# Patient Record
Sex: Male | Born: 1970 | Race: White | Hispanic: No | Marital: Married | Smoking: Current every day smoker
Health system: Southern US, Community
[De-identification: ages and names within clinical notes are randomized; demographics above are authoritative.]

## PROBLEM LIST (undated history)

## (undated) DIAGNOSIS — G43909 Migraine, unspecified, not intractable, without status migrainosus: Secondary | ICD-10-CM

## (undated) DIAGNOSIS — F32A Depression, unspecified: Secondary | ICD-10-CM

## (undated) DIAGNOSIS — F329 Major depressive disorder, single episode, unspecified: Secondary | ICD-10-CM

## (undated) DIAGNOSIS — M549 Dorsalgia, unspecified: Secondary | ICD-10-CM

## (undated) DIAGNOSIS — G8929 Other chronic pain: Secondary | ICD-10-CM

## (undated) DIAGNOSIS — F319 Bipolar disorder, unspecified: Secondary | ICD-10-CM

## (undated) HISTORY — PX: FOOT SURGERY: SHX648

---

## 1997-07-06 ENCOUNTER — Emergency Department (HOSPITAL_COMMUNITY): Admission: EM | Admit: 1997-07-06 | Discharge: 1997-07-06 | Payer: Self-pay | Admitting: Emergency Medicine

## 2000-05-06 ENCOUNTER — Emergency Department (HOSPITAL_COMMUNITY): Admission: EM | Admit: 2000-05-06 | Discharge: 2000-05-06 | Payer: Self-pay | Admitting: Emergency Medicine

## 2000-12-21 ENCOUNTER — Emergency Department (HOSPITAL_COMMUNITY): Admission: EM | Admit: 2000-12-21 | Discharge: 2000-12-21 | Payer: Self-pay | Admitting: *Deleted

## 2000-12-21 ENCOUNTER — Encounter: Payer: Self-pay | Admitting: *Deleted

## 2001-06-02 ENCOUNTER — Encounter: Payer: Self-pay | Admitting: Emergency Medicine

## 2001-06-02 ENCOUNTER — Emergency Department (HOSPITAL_COMMUNITY): Admission: EM | Admit: 2001-06-02 | Discharge: 2001-06-02 | Payer: Self-pay | Admitting: Emergency Medicine

## 2002-03-23 ENCOUNTER — Emergency Department (HOSPITAL_COMMUNITY): Admission: EM | Admit: 2002-03-23 | Discharge: 2002-03-23 | Payer: Self-pay | Admitting: Emergency Medicine

## 2003-01-31 ENCOUNTER — Emergency Department (HOSPITAL_COMMUNITY): Admission: EM | Admit: 2003-01-31 | Discharge: 2003-01-31 | Payer: Self-pay | Admitting: Emergency Medicine

## 2004-04-06 ENCOUNTER — Emergency Department (HOSPITAL_COMMUNITY): Admission: EM | Admit: 2004-04-06 | Discharge: 2004-04-06 | Payer: Self-pay | Admitting: Emergency Medicine

## 2004-11-03 ENCOUNTER — Emergency Department (HOSPITAL_COMMUNITY): Admission: EM | Admit: 2004-11-03 | Discharge: 2004-11-04 | Payer: Self-pay | Admitting: *Deleted

## 2005-05-17 ENCOUNTER — Emergency Department (HOSPITAL_COMMUNITY): Admission: EM | Admit: 2005-05-17 | Discharge: 2005-05-17 | Payer: Self-pay | Admitting: Emergency Medicine

## 2005-10-24 ENCOUNTER — Encounter: Payer: Self-pay | Admitting: Emergency Medicine

## 2005-10-24 ENCOUNTER — Ambulatory Visit: Payer: Self-pay | Admitting: *Deleted

## 2005-10-24 ENCOUNTER — Inpatient Hospital Stay (HOSPITAL_COMMUNITY): Admission: EM | Admit: 2005-10-24 | Discharge: 2005-10-24 | Payer: Self-pay | Admitting: Cardiology

## 2005-10-30 ENCOUNTER — Ambulatory Visit: Payer: Self-pay | Admitting: Cardiology

## 2005-12-19 ENCOUNTER — Ambulatory Visit: Payer: Self-pay | Admitting: Gastroenterology

## 2006-04-14 ENCOUNTER — Emergency Department (HOSPITAL_COMMUNITY): Admission: EM | Admit: 2006-04-14 | Discharge: 2006-04-15 | Payer: Self-pay | Admitting: Emergency Medicine

## 2006-11-24 ENCOUNTER — Emergency Department (HOSPITAL_COMMUNITY): Admission: EM | Admit: 2006-11-24 | Discharge: 2006-11-24 | Payer: Self-pay | Admitting: Emergency Medicine

## 2007-08-02 ENCOUNTER — Emergency Department (HOSPITAL_COMMUNITY): Admission: EM | Admit: 2007-08-02 | Discharge: 2007-08-02 | Payer: Self-pay | Admitting: Emergency Medicine

## 2009-10-29 ENCOUNTER — Emergency Department (HOSPITAL_COMMUNITY): Admission: EM | Admit: 2009-10-29 | Discharge: 2009-10-29 | Payer: Self-pay | Admitting: Emergency Medicine

## 2010-03-20 ENCOUNTER — Emergency Department (HOSPITAL_COMMUNITY)
Admission: EM | Admit: 2010-03-20 | Discharge: 2010-03-21 | Disposition: A | Discharge status: Home / Self Care | Payer: Self-pay | Attending: Emergency Medicine | Admitting: Emergency Medicine

## 2010-03-20 DIAGNOSIS — R252 Cramp and spasm: Secondary | ICD-10-CM | POA: Insufficient documentation

## 2010-06-15 ENCOUNTER — Emergency Department (HOSPITAL_COMMUNITY)
Admission: EM | Admit: 2010-06-15 | Discharge: 2010-06-16 | Disposition: A | Discharge status: Home / Self Care | Payer: Self-pay | Attending: Emergency Medicine | Admitting: Emergency Medicine

## 2010-06-15 DIAGNOSIS — N201 Calculus of ureter: Secondary | ICD-10-CM | POA: Insufficient documentation

## 2010-06-16 ENCOUNTER — Emergency Department (HOSPITAL_COMMUNITY): Payer: Self-pay

## 2010-06-16 LAB — DIFFERENTIAL
Basophils Relative: 0 % (ref 0–1)
Lymphocytes Relative: 8 % — ABNORMAL LOW (ref 12–46)
Lymphs Abs: 1.2 10*3/uL (ref 0.7–4.0)
Monocytes Relative: 5 % (ref 3–12)
Neutro Abs: 12.5 10*3/uL — ABNORMAL HIGH (ref 1.7–7.7)
Neutrophils Relative %: 86 % — ABNORMAL HIGH (ref 43–77)

## 2010-06-16 LAB — COMPREHENSIVE METABOLIC PANEL
AST: 16 U/L (ref 0–37)
CO2: 25 mEq/L (ref 19–32)
Chloride: 103 mEq/L (ref 96–112)
Creatinine, Ser: 1.36 mg/dL (ref 0.4–1.5)
GFR calc Af Amer: >60 mL/min (ref >60)
GFR calc non Af Amer: 58 mL/min — ABNORMAL LOW (ref >60)
Glucose, Bld: 124 mg/dL — ABNORMAL HIGH (ref 70–99)
Total Bilirubin: 0.4 mg/dL (ref 0.3–1.2)

## 2010-06-16 LAB — CBC
HCT: 41.2 % (ref 39.0–52.0)
Hemoglobin: 14.1 g/dL (ref 13.0–17.0)
MCH: 31.3 pg (ref 26.0–34.0)
MCV: 91.6 fL (ref 78.0–100.0)
RBC: 4.5 MIL/uL (ref 4.22–5.81)

## 2010-06-16 MED ORDER — IOHEXOL 300 MG/ML  SOLN
100.0000 mL | Freq: Once | INTRAMUSCULAR | Status: AC | PRN
  Administered 2010-06-16: 100 mL via INTRAVENOUS

## 2010-07-06 NOTE — H&P (Signed)
NAMEESMERALDA, [name]               ACCOUNT NO.:  [id number]   MEDICAL RECORD NO.:  [id number]          PATIENT TYPE:  INP   LOCATION:  3733                         FACILITY:  [facility]   PHYSICIAN:  Garrison Columbus. Haithcock, MDDATE OF BIRTH:  1970-03-23   DATE OF ADMISSION:  10/24/2005  DATE OF DISCHARGE:                                HISTORY & PHYSICAL   CHIEF COMPLAINT:  Chest pain.   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  He presented to the  emergency room and was found to have a non-ST-segment-elevation MI and was  transferred to [facility] for cardiac catheterization.   PAST MEDICAL HISTORY:  Hypertension.   FAMILY HISTORY:  Coronary artery disease and diabetes.   REVIEW OF SYSTEMS:  Otherwise negative.   MEDICATIONS:  Verapamil, unknown dose.   PHYSICAL EXAMINATION:  VITAL SIGNS:  Temperature 98.7, blood pressure  132/74, pulse is 53.  GENERAL:  He is a pleasant man in no apparent distress.  NECK:  Supple.  LUNGS:  Clear to auscultation bilaterally.  CARDIOVASCULAR:  Regular, S1 and S2, with no murmurs, rubs, or gallops.  ABDOMEN:  Soft and nontender.  NEUROLOGICAL:  Exam was nonfocal.   LABORATORY AND ACCESSORY CLINICAL DATA:  Labs pending.   EKG:  Nonspecific ST changes.   ASSESSMENT AND PLAN:  [age]-year-old gentleman with atypical chest  pain story and nonspecific ST changes.  We will admit after emergent cardiac  catheterization and we will manage based on the results of that study.      Daniel B. Haithcock, MD  Electronically Signed     DBH/MEDQ  D:  10/24/2005  T:  10/24/2005  Job:  [id number]

## 2010-07-06 NOTE — Assessment & Plan Note (Signed)
[facility]                           GASTROENTEROLOGY OFFICE NOTE   NAME:Montilla, REES SANTISTEVAN                      MRN:          [id number]  DATE:12/19/2005                            DOB:          12-19-1970    REFERRING PHYSICIAN:  Corrie Dandy, NP Daphine Deutscher   REASON FOR REFERRAL:  Chest pain.   HISTORY OF PRESENT ILLNESS:  Mr. Molinelli is a 40 year old white male who  relates a 42-month history of recurrent chest pain.  He describes chest pain  in his mid anterior chest and left upper chest that is brought on by  coughing, movement and lifting.  It  has not been associated with exertion  or shortness of breath.  He has no abdominal pain, reflux symptoms, nausea,  vomiting, weight loss, dysphagia, odynophagia, melena, hematochezia or  change in his bowel habits.  He was hospitalized in September of this year  for a severe episode of chest pain that radiated into his back and jaw,  associated with dizziness.  He was seen at [facility] and referred  to [facility]. He underwent cardiac catheterization which  apparently revealed normal coronary arteries and ejection fraction of 60%.  He was placed on Protonix which he has taken regularly for the past 4 to 6  weeks with no change in symptoms.  He has taken Xanax twice a day for  management of anxiety for about 2 years. Tylenol and Advil have been  somewhat helpful with his symptoms but only give partial and temporary  relief.  He is employed at a [employer] and does a substantial amount of  physical labor which tends to exacerbate his chest complaints.  I reviewed  the discharge summary from his hospitalization dated October 24, 2005 as  well as the emergency room note for that same admission.  In addition, he  relates cough productive of clear sputum for about the past 2 weeks.   PAST MEDICAL HISTORY:  Hypertension and anxiety.   PAST SURGICAL HISTORY:  Negative.   SOCIAL HISTORY:  He is  married with 2 children.  He works in a Doctor, general practice.  He smokes 1 pack of cigarettes a day and denies  alcohol usage.   REVIEW OF SYSTEMS:  Remarkable for sleeping problems and occasional dyspnea.   PHYSICAL EXAMINATION:  Well-developed, well-nourished white male who appears  mildly anxious.  Height 6 feet 1 inch.  Weight 161.4 pounds.  Blood pressure is 120/70. Pulse  88 and regular.  HEENT:  Anicteric sclerae, oropharynx clear, edentulous.  NECK:  Without thyromegaly or adenopathy.  CHEST:  Clear to auscultation bilaterally.  There is mild upper sternal and  left upper chest wall tenderness.  He did cough with deep inspiration during  his chest examination which precipitated his chest pain symptoms.  CARDIAC:  Regular rate and rhythm without murmurs appreciated.  ABDOMEN:  Soft, nontender, nondistended, normoactive bowel sounds.  No  palpable organomegaly, masses or hernias.  EXTREMITIES:  No clubbing, cyanosis or edema.  NEUROLOGIC:  Alert and oriented x3.  Grossly nonfocal.   ASSESSMENT AND  PLAN:  Musculoskeletal chest pain.  R/O pulmonary etiologies.  No evidence for gastrointestinal disorders.  Begin Celebrex 200 mg p.o.  b.i.d.  May discontinue Protonix.  Defer further evaluation and followup to  Paulene Floor, NP.  I have asked the patient to schedule a followup  appointment with her within the next several days.     Venita Lick. Russella Dar, MD, [facility]  Electronically Signed    MTS/MedQ  DD: 12/19/2005  DT: 12/19/2005  Job #: [id number]   cc:   Corrie Dandy, NP Daphine Deutscher

## 2010-07-06 NOTE — Discharge Summary (Signed)
[name], [name] NO.:  [id number]   MEDICAL RECORD NO.:  [id number]          PATIENT TYPE:  INP   LOCATION:  3733                         FACILITY:  [facility]   PHYSICIAN:  Joellyn Rued, PA-C     DATE OF BIRTH:  08/17/1970   DATE OF ADMISSION:  10/24/2005  DATE OF DISCHARGE:  10/24/2005                           DISCHARGE SUMMARY - REFERRING   DISCHARGE DIAGNOSES:  1. Noncardiac chest discomfort.  Cardiac catheterization did not reveal      any evidence of coronary artery disease.  2. Hypertension.  3. Tobacco use.   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  He presented to the hospital at [facility].  It was felt that  he was having a myocardial infarction.  He was transferred to [facility] for  cardiac catheterization.   MEDICAL HISTORY:  Notable for tobacco use and hypertension.   LABORATORY:  Chest x-ray at [facility] did not show any acute disease.  H&H  on admission was 12.4 and 36, normal indices, platelets 138, WBCs 5.1.  PTT  32, PT 15.1.  D-dimer less than 0.22.  Admission sodium 141, potassium 388,  BUN 10, creatinine 1.1.  LFTs within normal limits.  CK, MB and troponin  were within normal limits .  EKGs from [facility] are reported to showed  nonspecific ST-T wave changes.  However, I cannot find EKGs on the chart at  the time of this dictation.  Prior to discharge, EKG showed normal sinus  rhythm, baseline artifact, PVC.   HOSPITAL COURSE:  [name] was taken to the cath lab by Dr. Juanda Chance.  His  catheterization did not reveal any coronary artery disease.  EF was 60%.  Dr. Juanda Chance felt his symptoms may be related to reflux and felt that post his  bed rest if his catheterization site was intact, he may be discharged home  with followup groin check and  appointment with his primary care physician.  On the morning of September 6, Dr. Eden Emms reassessed the patient and agreed  with plan.   DISPOSITION:  The patient is discharged home.  Asked to maintain low salt,  fat cholesterol diet.  Activities and wound care are per supplemental  discharge sheet.  He is asked to bring all medications to all follow-up  appointments.  He received a new prescription for Protonix 40 mg daily  and  asked to continue his home medications which are believed to include Xanax  and verapamil of unknown dosage.  He will follow up with Dr.  Daleen Squibb in the office for a groin check on October 30, 2005 at 11:15 and he  was asked to make an appointment with his medical doctor for follow-up.  He  was also advised no smoking or tobacco products.  He was given permission  return to work on October 27, 2005.  Discharge time less than 30 minutes.  ______________________________  Joellyn Rued, PA-C     EW/MEDQ  D:  10/24/2005  T:  10/24/2005  Job:  [id number]   cc:   Dr. Amada Jupiter

## 2010-07-06 NOTE — Assessment & Plan Note (Signed)
[facility]                         [place] CARDIOLOGY OFFICE NOTE   NAME:Medellin, Cohan                        MRN:          [id number]  DATE:10/30/2005                            DOB:          [birth year]    HISTORY OF PRESENT ILLNESS:  Mr. [name] returns today for followup of his  chest tightness and discomfort. He was transferred to [facility]  with a question of acute MI from [facility]. Cardiac  catheterization showed no obstructive coronary disease with normal left  ventricular function. He still is having 3 out of 10 chest discomfort in the  center of his chest. He is on Protonix. It was felt that most of his  symptoms were gastroesophageal reflux. He has had no melena, hematochezia.  He looks well otherwise. He is still smoking.   MEDICATIONS:  Verapamil 120 mg daily, alprazolam 2 mg b.i.d.   PHYSICAL EXAMINATION:  VITAL SIGNS:  Blood pressure today is 104/64, pulse  89 and regular. He is 6 foot 1 and weighs 158 pounds.  NECK:  Carotids are full. There is no JVD. Thyroid is not enlarged. Trachea  is midline.  LUNGS:  Clear.  CARDIOVASCULAR:  Regular rate and rhythm.  ABDOMEN:  Soft. Groin site shows a small ecchymotic area but no bruit.  Pulses were present.   LABORATORY DATA:  EKG is essentially normal.   ASSESSMENT:  1. Non-cardiac chest discomfort.  2. Probable gastroesophageal reflux.   RECOMMENDATIONS:  1. Continue Protonix.  2. Continue Verapamil for the time being for possible esophageal spasm.  3. Discontinue smoking.  4. We will see him back on a p.r.n. basis.                                   Thomas C. Daleen Squibb, MD, [facility]   TCW/MedQ  DD:  10/30/2005  DT:  10/31/2005  Job #:  [id number]

## 2010-07-06 NOTE — Cardiovascular Report (Signed)
[name], [name]               ACCOUNT NO.:  [id number]   MEDICAL RECORD NO.:  [id number]          PATIENT TYPE:  INP   LOCATION:  3733                         FACILITY:  [facility]   PHYSICIAN:  Everardo Beals. Juanda Chance, MD, FACCDATE OF BIRTH:  04-24-1970   DATE OF PROCEDURE:  10/24/2005  DATE OF DISCHARGE:                              CARDIAC CATHETERIZATION   CLINICAL HISTORY:  [name] is a 40 year old who has no prior history of  known heart disease.  He is a smoker.  Tonight, while he was working in the  [employer], he developed substernal tightness.  He went to [facility]  emergency room where his ECG showed what was felt to be mild ST elevation.  He was transferred to [facility] via [facility] and evaluated by Dr. Almyra Brace and myself.  We were not certain that his ECG was diagnostic of a  STEMI, but because of his symptoms we decided to proceed with  catheterization.   PROCEDURE:  The procedure was performed at the right femoral artery and  arterial sheath and 6-French preformed coronary catheters.  A front wall  anterior puncture was performed, Omnipaque contrast was used.  The right  femoral was closed Angio-Seal at the end of the procedure.  The patient  tolerated the procedure well and left the laboratory in satisfactory  condition.   RESULTS:  Aortic pressure was 115/74 with mean of 94.  The left ventricle  pressure was 115/14.   Left main coronary artery of the short vessel was free state of significant  disease.   Left anterior descending artery gave rise to two diagonal branches and a  septal perforator.  These and the LAD proper were free of significant  disease.   The circumflex artery gave rise to a marginal branch and AV branch which  terminated into a small posterolateral branch.  These vessels were free of  significant disease.   The right coronary artery was a moderate-sized vessel that gave rise to two  right ventricular  branches, a posterior  descending branch and a small  posterolateral branch.  These vessels were free of significant disease.   The left ventriculogram performed in the RAO projection showed good wall  motion with no areas of hypokinesis.  Estimated fraction was 6%.   CONCLUSION:  Normal coronary angiography, left ventricular wall motion.   In view of these findings, I think the patient's symptoms are not cardiac.  I do not think he had an ST elevation infarction, and I think it is not  likely that his symptoms are cardiac.  He does have significant symptoms of  esophageal reflux.  I suspect this is the etiology of his symptoms.  Will  check a D-dimer and cardiac enzymes.  If these are negative, will plan  discharge tomorrow with treatment for GERD with a proton pump inhibitor.           ______________________________  Everardo Beals Juanda Chance, MD, [facility]     BRB/MEDQ  D:  10/24/2005  T:  10/24/2005  Job:  [id number]   cc:   Dr. Zenda Alpers  Cardiopulmonary Lab

## 2010-07-06 NOTE — Procedures (Signed)
NAMEWRIGLEY, PLASENCIA NO.:  [id number]   MEDICAL RECORD NO.:  [id number]          PATIENT TYPE:  EMS   LOCATION:  ED                            FACILITY:  [facility]   PHYSICIAN:  Edward L. Juanetta Gosling, M.D.DATE OF BIRTH:  Dec 16, 1970   DATE OF PROCEDURE:  DATE OF DISCHARGE:  10/24/2005                                EKG INTERPRETATION   The time is 0022, October 24, 2005.   The rhythm is sinus rhythm with a rate in the 60s.  The axis is vertical.  The computers read, inferior injury or acute infarction.  I do not see  evidence of that.  There is minimal ST elevation inferiorly, which looks  more like an interventricular conduction delay.   Mildly abnormal electrocardiogram.      Edward L. Juanetta Gosling, M.D.  Electronically Signed     ELH/MEDQ  D:  10/24/2005  T:  10/25/2005  Job:  [id number]

## 2010-12-02 ENCOUNTER — Emergency Department (HOSPITAL_COMMUNITY): Payer: Self-pay

## 2010-12-02 ENCOUNTER — Encounter: Payer: Self-pay | Admitting: *Deleted

## 2010-12-02 ENCOUNTER — Emergency Department (HOSPITAL_COMMUNITY)
Admission: EM | Admit: 2010-12-02 | Discharge: 2010-12-02 | Disposition: A | Discharge status: Home / Self Care | Payer: Self-pay | Attending: Emergency Medicine | Admitting: Emergency Medicine

## 2010-12-02 DIAGNOSIS — X500XXA Overexertion from strenuous movement or load, initial encounter: Secondary | ICD-10-CM | POA: Insufficient documentation

## 2010-12-02 DIAGNOSIS — M545 Low back pain, unspecified: Secondary | ICD-10-CM | POA: Insufficient documentation

## 2010-12-02 DIAGNOSIS — IMO0002 Reserved for concepts with insufficient information to code with codable children: Secondary | ICD-10-CM | POA: Insufficient documentation

## 2010-12-02 DIAGNOSIS — M5416 Radiculopathy, lumbar region: Secondary | ICD-10-CM

## 2010-12-02 HISTORY — DX: Depression, unspecified: F32.A

## 2010-12-02 HISTORY — DX: Major depressive disorder, single episode, unspecified: F32.9

## 2010-12-02 MED ORDER — PREDNISONE 20 MG PO TABS
60.0000 mg | ORAL_TABLET | Freq: Once | ORAL | Status: AC
  Administered 2010-12-02: 60 mg via ORAL
  Filled 2010-12-02: qty 3

## 2010-12-02 MED ORDER — PREDNISONE 50 MG PO TABS: 50.0000 mg | ORAL_TABLET | Freq: Every day | ORAL | Status: AC

## 2010-12-02 MED ORDER — HYDROCODONE-ACETAMINOPHEN 5-325 MG PO TABS
1.0000 | ORAL_TABLET | Freq: Once | ORAL | Status: AC
  Administered 2010-12-02: 1 via ORAL
  Filled 2010-12-02: qty 1

## 2010-12-02 MED ORDER — HYDROCODONE-ACETAMINOPHEN 5-325 MG PO TABS: ORAL_TABLET | ORAL | Status: DC

## 2010-12-02 NOTE — ED Notes (Signed)
Pt states lower back pain, radiating down left leg. Pain x 2 weeks.

## 2010-12-02 NOTE — ED Provider Notes (Signed)
Medical screening examination/treatment/procedure(s) were performed by non-physician practitioner and as supervising physician I was immediately available for consultation/collaboration.  Glynn Octave, MD 12/02/10 [phone]

## 2010-12-02 NOTE — ED Provider Notes (Signed)
History     CSN: [id number] Arrival date & time: 12/02/2010 10:08 AM  Chief Complaint  Patient presents with  . Back Pain    (Consider location/radiation/quality/duration/timing/severity/associated sxs/prior treatment) HPI Comments: States he twisted to get out of his car ~ 2 weeks ago and felt a "pop" and has pain in lower back since then.  He describes pain and numbness radiating down L leg to his foot.  Has taken an occasional ibuprofen with minimal relief.  Patient is a 40 y.o. male presenting with back pain. The history is provided by the patient. No language interpreter was used.  Back Pain  This is a new problem. Episode onset: 2 weeks ago. The problem occurs constantly. The problem has not changed since onset.The pain is associated with twisting. The symptoms are aggravated by certain positions and bending. Associated symptoms include numbness and leg pain. Pertinent negatives include no weakness.    Past Medical History  Diagnosis Date  . Depression     No past surgical history on file.  No family history on file.  History  Substance Use Topics  . Smoking status: Current Everyday Smoker  . Smokeless tobacco: Not on file  . Alcohol Use: No      Review of Systems  Musculoskeletal: Positive for back pain.  Neurological: Positive for numbness. Negative for weakness.  All other systems reviewed and are negative.    Allergies  Review of patient's allergies indicates no known allergies.  Home Medications  No current outpatient prescriptions on file.  BP 129/87  Pulse 63  Temp(Src) 97.9 F (36.6 C) (Oral)  Resp 16  Ht 6\' 1"  (1.854 m)  Wt 176 lb (79.833 kg)  BMI 23.22 kg/m2  SpO2 100%  Physical Exam  Nursing note and vitals reviewed. Constitutional: He is oriented to person, place, and time. He appears well-developed and well-nourished. No distress.  HENT:  Head: Normocephalic and atraumatic.  Eyes: EOM are normal.  Neck: Normal range of motion.    Cardiovascular: Normal rate, regular rhythm, normal heart sounds and intact distal pulses.   Pulmonary/Chest: Effort normal and breath sounds normal. No respiratory distress.  Abdominal: Soft. He exhibits no distension. There is no tenderness.  Musculoskeletal: He exhibits tenderness.       Back:       Legs:      Localizes pain to area of L SI joint with radiation to bottom of L foot.  Neurological: He is alert and oriented to person, place, and time. He has normal strength. He displays no atrophy and no tremor. He displays no seizure activity. GCS eye subscore is 4. GCS verbal subscore is 5. GCS motor subscore is 6.  Reflex Scores:      Patellar reflexes are 2+ on the right side and 2+ on the left side.      Achilles reflexes are 2+ on the right side and 2+ on the left side. Skin: Skin is warm and dry. He is not diaphoretic.  Psychiatric: He has a normal mood and affect. Judgment normal.    ED Course  Procedures (including critical care time)  Labs Reviewed - No data to display No results found.   No diagnosis found.    MDM  Shared x-ray results with pt.  Will f/u with ortho.        Worthy Rancher, PA 12/02/10 [phone]

## 2011-03-17 ENCOUNTER — Other Ambulatory Visit: Payer: Self-pay

## 2011-03-17 ENCOUNTER — Emergency Department (HOSPITAL_COMMUNITY): Payer: Self-pay

## 2011-03-17 ENCOUNTER — Encounter (HOSPITAL_COMMUNITY): Payer: Self-pay | Admitting: Emergency Medicine

## 2011-03-17 ENCOUNTER — Emergency Department (HOSPITAL_COMMUNITY)
Admission: EM | Admit: 2011-03-17 | Discharge: 2011-03-17 | Disposition: A | Discharge status: Home / Self Care | Payer: Self-pay | Attending: Emergency Medicine | Admitting: Emergency Medicine

## 2011-03-17 DIAGNOSIS — F329 Major depressive disorder, single episode, unspecified: Secondary | ICD-10-CM | POA: Insufficient documentation

## 2011-03-17 DIAGNOSIS — F17200 Nicotine dependence, unspecified, uncomplicated: Secondary | ICD-10-CM | POA: Insufficient documentation

## 2011-03-17 DIAGNOSIS — F411 Generalized anxiety disorder: Secondary | ICD-10-CM | POA: Insufficient documentation

## 2011-03-17 DIAGNOSIS — F419 Anxiety disorder, unspecified: Secondary | ICD-10-CM

## 2011-03-17 DIAGNOSIS — F3289 Other specified depressive episodes: Secondary | ICD-10-CM | POA: Insufficient documentation

## 2011-03-17 DIAGNOSIS — R0789 Other chest pain: Secondary | ICD-10-CM | POA: Insufficient documentation

## 2011-03-17 LAB — COMPREHENSIVE METABOLIC PANEL
ALT: 11 U/L (ref 0–53)
AST: 15 U/L (ref 0–37)
Albumin: 3.8 g/dL (ref 3.5–5.2)
Calcium: 9.1 mg/dL (ref 8.4–10.5)
Sodium: 137 mEq/L (ref 135–145)
Total Protein: 6.4 g/dL (ref 6.0–8.3)

## 2011-03-17 LAB — DIFFERENTIAL
Basophils Relative: 1 % (ref 0–1)
Eosinophils Absolute: 0.1 10*3/uL (ref 0.0–0.7)
Lymphs Abs: 3.7 10*3/uL (ref 0.7–4.0)
Neutrophils Relative %: 34 % — ABNORMAL LOW (ref 43–77)

## 2011-03-17 LAB — CBC
MCH: 31.4 pg (ref 26.0–34.0)
MCHC: 34.3 g/dL (ref 30.0–36.0)
Platelets: 225 10*3/uL (ref 150–400)
RBC: 4.36 MIL/uL (ref 4.22–5.81)

## 2011-03-17 LAB — ETHANOL: Alcohol, Ethyl (B): 181 mg/dL — ABNORMAL HIGH (ref 0–11)

## 2011-03-17 MED ORDER — OXYCODONE-ACETAMINOPHEN 5-325 MG PO TABS
1.0000 | ORAL_TABLET | Freq: Once | ORAL | Status: AC
  Administered 2011-03-17: 1 via ORAL
  Filled 2011-03-17: qty 1

## 2011-03-17 MED ORDER — TRAMADOL HCL 50 MG PO TABS: 50.0000 mg | ORAL_TABLET | Freq: Four times a day (QID) | ORAL | Status: AC | PRN

## 2011-03-17 NOTE — ED Notes (Signed)
Radiology at the bedside

## 2011-03-17 NOTE — ED Notes (Addendum)
Pt c/o non radiating centralized chest pain which started tonight 10/10. No n/v or diaphoresis. Pt reports taking 3 baby ASA with no relief. Pt took 6 10 mg Diazepam with 2 40's of beer prior to chest pain starting. Pt wife reported that the pt passed out. Pt also states that he has been dealing with a lot of stress dealing with his step kids states "they think they over rule me."

## 2011-03-17 NOTE — ED Provider Notes (Signed)
History   This chart was scribed for Benny Lennert, MD by Sofie Rower. The patient was seen in room APA04/APA04 and the patient's care was started at 9:16PM.    CSN: [id number]  Arrival date & time 03/17/11  2023   First MD Initiated Contact with Patient 03/17/11 2109      Chief Complaint  Patient presents with  . Chest Pain    (Consider location/radiation/quality/duration/timing/severity/associated sxs/prior treatment) Patient is a 41 y.o. male presenting with chest pain. The history is provided by the patient. No language interpreter was used.  Chest Pain The chest pain began more  than 1 month ago. Chest pain occurs frequently. The chest pain is resolved. The pain is associated with stress. The severity of the pain is moderate. The quality of the pain is described as sharp. The pain radiates to the left arm. Chest pain is worsened by stress. Pertinent negatives for primary symptoms include no fever, no fatigue, no cough and no abdominal pain. Associated symptoms comments: LOC.. Risk factors include stress.  Pertinent negatives for past medical history include no seizures. Family history comments: Pt states he has arguments with his step son which causes him "stress".     Past Medical History  Diagnosis Date  . Depression     History reviewed. No pertinent past surgical history.  History reviewed. No pertinent family history.  History  Substance Use Topics  . Smoking status: Current Everyday Smoker  . Smokeless tobacco: Not on file  . Alcohol Use: Yes      Review of Systems  Constitutional: Negative for fever and fatigue.  HENT: Negative for congestion, sinus pressure and ear discharge.   Eyes: Negative for discharge.  Respiratory: Negative for cough.   Cardiovascular: Positive for chest pain.  Gastrointestinal: Negative for abdominal pain and diarrhea.  Genitourinary: Negative for frequency and hematuria.  Musculoskeletal: Negative for back pain.  Skin: Negative  for rash.  Neurological: Negative for seizures and headaches.  Hematological: Negative.   Psychiatric/Behavioral: Negative for hallucinations.  All other systems reviewed and are negative.    Allergies  Review of patient's allergies indicates no known allergies.  Home Medications   Current Outpatient Rx  Name Route Sig Dispense Refill  . DIAZEPAM 10 MG PO TABS Oral Take 10 mg by mouth 2 (two) times daily.      BP 122/87  Temp(Src) 97.6 F (36.4 C) (Oral)  Resp 16  Ht 6\' 1"  (1.854 m)  Wt 185 lb (83.915 kg)  BMI 24.41 kg/m2  SpO2 98%  Physical Exam  Nursing note and vitals reviewed. Constitutional: He is oriented to person, place, and time. He appears well-developed.  HENT:  Head: Normocephalic and atraumatic.  Eyes: Conjunctivae and EOM are normal. No scleral icterus.  Neck: Neck supple. No thyromegaly present.  Cardiovascular: Normal rate and regular rhythm.  Exam reveals no gallop and no friction rub.   No murmur heard. Pulmonary/Chest: No stridor. He has no wheezes. He has no rales. He exhibits tenderness (Left anterior. ).  Abdominal: He exhibits no distension. There is no tenderness. There is no rebound.  Musculoskeletal: Normal range of motion. He exhibits no edema.  Lymphadenopathy:    He has no cervical adenopathy.  Neurological: He is oriented to person, place, and time. Coordination normal.  Skin: No rash noted. No erythema.  Psychiatric: He has a normal mood and affect. His behavior is normal.    ED Course  Procedures (including critical care time)  DIAGNOSTIC STUDIES: Oxygen Saturation is  98% on room air, normal by my interpretation.    COORDINATION OF CARE:    Results for orders placed during the hospital encounter of 03/17/11  CBC      Component Value Range   WBC 6.8  4.0 - 10.5 (K/uL)   RBC 4.36  4.22 - 5.81 (MIL/uL)   Hemoglobin 13.7  13.0 - 17.0 (g/dL)   HCT 62.1  30.8 - 65.7 (%)   MCV 91.7  78.0 - 100.0 (fL)   MCH 31.4  26.0 - 34.0 (pg)    MCHC 34.3  30.0 - 36.0 (g/dL)   RDW 84.6  96.2 - 95.2 (%)   Platelets 225  150 - 400 (K/uL)  DIFFERENTIAL      Component Value Range   Neutrophils Relative 34 (*) 43 - 77 (%)   Neutro Abs 2.3  1.7 - 7.7 (K/uL)   Lymphocytes Relative 54 (*) 12 - 46 (%)   Lymphs Abs 3.7  0.7 - 4.0 (K/uL)   Monocytes Relative 10  3 - 12 (%)   Monocytes Absolute 0.7  0.1 - 1.0 (K/uL)   Eosinophils Relative 1  0 - 5 (%)   Eosinophils Absolute 0.1  0.0 - 0.7 (K/uL)   Basophils Relative 1  0 - 1 (%)   Basophils Absolute 0.0  0.0 - 0.1 (K/uL)  ETHANOL      Component Value Range   Alcohol, Ethyl (B) 181 (*) 0 - 11 (mg/dL)  COMPREHENSIVE METABOLIC PANEL      Component Value Range   Sodium 137  135 - 145 (mEq/L)   Potassium 3.3 (*) 3.5 - 5.1 (mEq/L)   Chloride 100  96 - 112 (mEq/L)   CO2 25  19 - 32 (mEq/L)   Glucose, Bld 86  70 - 99 (mg/dL)   BUN 8  6 - 23 (mg/dL)   Creatinine, Ser 8.41  0.50 - 1.35 (mg/dL)   Calcium 9.1  8.4 - 32.4 (mg/dL)   Total Protein 6.4  6.0 - 8.3 (g/dL)   Albumin 3.8  3.5 - 5.2 (g/dL)   AST 15  0 - 37 (U/L)   ALT 11  0 - 53 (U/L)   Alkaline Phosphatase 58  39 - 117 (U/L)   Total Bilirubin 0.2 (*) 0.3 - 1.2 (mg/dL)   GFR calc non Af Amer >90  >90 (mL/min)   GFR calc Af Amer >90  >90 (mL/min)  POCT I-STAT TROPONIN I      Component Value Range   Troponin i, poc 0.00  0.00 - 0.08 (ng/mL)   Comment 3            Dg Chest Portable 1 View  03/17/2011  *RADIOLOGY REPORT*  Clinical Data: Severe chest pain  PORTABLE CHEST - 1 VIEW  Comparison: 10/24/2005  Findings: Shallow inspiration.  Heart size and pulmonary vascularity are normal for technique.  No focal airspace consolidation in the lungs.  No blunting of costophrenic angles. No pneumothorax.  No significant change since previous study.  IMPRESSION: No evidence of active pulmonary disease.  Original Report Authenticated By: Marlon Pel, M.D.     Date: 03/17/2011  Rate: 76  Rhythm: normal sinus rhythm  QRS Axis:  normal  Intervals: normal  ST/T Wave abnormalities: normal  Conduction Disutrbances:none  Narrative Interpretation:   Old EKG Reviewed: none available       MDM  Anxiety causing noncardiac chest pain  9:20PM- EDP at bedside discusses treatment plan.The chart was scribed for me under my  direct supervision.  I personally performed the history, physical, and medical decision making and all procedures in the evaluation of this patient.Benny Lennert, MD 03/17/11 [phone]

## 2011-03-17 NOTE — ED Notes (Signed)
MD at bedside. 

## 2012-02-19 ENCOUNTER — Encounter (HOSPITAL_COMMUNITY): Payer: Self-pay

## 2012-02-19 ENCOUNTER — Emergency Department (HOSPITAL_COMMUNITY)
Admission: EM | Admit: 2012-02-19 | Discharge: 2012-02-19 | Disposition: A | Discharge status: Home / Self Care | Payer: Self-pay | Attending: Emergency Medicine | Admitting: Emergency Medicine

## 2012-02-19 DIAGNOSIS — F17200 Nicotine dependence, unspecified, uncomplicated: Secondary | ICD-10-CM | POA: Insufficient documentation

## 2012-02-19 DIAGNOSIS — Z8659 Personal history of other mental and behavioral disorders: Secondary | ICD-10-CM | POA: Insufficient documentation

## 2012-02-19 DIAGNOSIS — M5430 Sciatica, unspecified side: Secondary | ICD-10-CM | POA: Insufficient documentation

## 2012-02-19 MED ORDER — KETOROLAC TROMETHAMINE 10 MG PO TABS
10.0000 mg | ORAL_TABLET | Freq: Once | ORAL | Status: AC
  Administered 2012-02-19: 10 mg via ORAL
  Filled 2012-02-19: qty 1

## 2012-02-19 MED ORDER — MORPHINE SULFATE 4 MG/ML IJ SOLN
8.0000 mg | Freq: Once | INTRAMUSCULAR | Status: AC
  Administered 2012-02-19: 8 mg via INTRAMUSCULAR
  Filled 2012-02-19: qty 2

## 2012-02-19 MED ORDER — BACLOFEN 10 MG PO TABS: 21.0000 mg | ORAL_TABLET | Freq: Three times a day (TID) | ORAL | Status: DC

## 2012-02-19 MED ORDER — MELOXICAM 7.5 MG PO TABS: ORAL_TABLET | ORAL | Status: DC

## 2012-02-19 MED ORDER — ONDANSETRON HCL 4 MG PO TABS
4.0000 mg | ORAL_TABLET | Freq: Once | ORAL | Status: AC
  Administered 2012-02-19: 4 mg via ORAL
  Filled 2012-02-19: qty 1

## 2012-02-19 MED ORDER — HYDROCODONE-ACETAMINOPHEN 5-325 MG PO TABS: 1.0000 | ORAL_TABLET | ORAL | Status: DC | PRN

## 2012-02-19 NOTE — ED Notes (Signed)
Pt presents with lower back pain with radiation of pain down rt leg. Pt denies known injury. NAD noted

## 2012-02-19 NOTE — ED Provider Notes (Signed)
History     CSN: [id number]  Arrival date & time 02/19/12  1346   First MD Initiated Contact with Patient 02/19/12 1603      Chief Complaint  Patient presents with  . Back Pain    (Consider location/radiation/quality/duration/timing/severity/associated sxs/prior treatment) Patient is a 42 y.o. male presenting with back pain. The history is provided by the patient.  Back Pain  This is a chronic problem. The current episode started more than 1 week ago. The problem occurs daily. The problem has not changed since onset.The pain is associated with no known injury. The pain is present in the lumbar spine. The quality of the pain is described as aching. The pain radiates to the right thigh. The pain is moderate. The symptoms are aggravated by certain positions. The pain is the same all the time. Pertinent negatives include no chest pain, no abdominal pain, no bowel incontinence, no perianal numbness, no bladder incontinence and no dysuria. He has tried analgesics for the symptoms. The treatment provided no relief.    Past Medical History  Diagnosis Date  . Depression     Past Surgical History  Procedure Date  . Foot surgery     No family history on file.  History  Substance Use Topics  . Smoking status: Current Every Day Smoker  . Smokeless tobacco: Not on file  . Alcohol Use: No      Review of Systems  Constitutional: Negative for activity change.       All ROS Neg except as noted in HPI  HENT: Negative for nosebleeds and neck pain.   Eyes: Negative for photophobia and discharge.  Respiratory: Negative for cough, shortness of breath and wheezing.   Cardiovascular: Negative for chest pain and palpitations.  Gastrointestinal: Negative for abdominal pain, blood in stool and bowel incontinence.  Genitourinary: Negative for bladder incontinence, dysuria, frequency and hematuria.  Musculoskeletal: Positive for back pain and arthralgias.  Skin: Negative.   Neurological:  Negative for dizziness, seizures and speech difficulty.  Psychiatric/Behavioral: Negative for hallucinations and confusion.    Allergies  Review of patient's allergies indicates no known allergies.  Home Medications   Current Outpatient Rx  Name  Route  Sig  Dispense  Refill  . DIAZEPAM 10 MG PO TABS   Oral   Take 10 mg by mouth 2 (two) times daily.           BP 131/78  Pulse 69  Temp 97.4 F (36.3 C) (Oral)  Resp 18  Ht 6\' 2"  (1.88 m)  Wt 185 lb (83.915 kg)  BMI 23.75 kg/m2  SpO2 100%  Physical Exam  Nursing note and vitals reviewed. Constitutional: He is oriented to person, place, and time. He appears well-developed and well-nourished.  Non-toxic appearance.  HENT:  Head: Normocephalic.  Right Ear: Tympanic membrane and external ear normal.  Left Ear: Tympanic membrane and external ear normal.  Eyes: EOM and lids are normal. Pupils are equal, round, and reactive to light.  Neck: Normal range of motion. Neck supple. Carotid bruit is not present.  Cardiovascular: Normal rate, regular rhythm, normal heart sounds, intact distal pulses and normal pulses.   Pulmonary/Chest: Breath sounds normal. No respiratory distress.  Abdominal: Soft. Bowel sounds are normal. There is no tenderness. There is no guarding.  Musculoskeletal: Normal range of motion.       Right paraspinal pain in the lumbar area. Severe pain of  The lower back with straight leg raise.  Lymphadenopathy:  Head (right side): No submandibular adenopathy present.       Head (left side): No submandibular adenopathy present.    He has no cervical adenopathy.  Neurological: He is alert and oriented to person, place, and time. He has normal strength. No cranial nerve deficit or sensory deficit. He exhibits normal muscle tone. Coordination normal.       No gross neuro deficit noted on exam.  Skin: Skin is warm and dry.  Psychiatric: He has a normal mood and affect. His speech is normal.    ED Course    Procedures (including critical care time)  Labs Reviewed - No data to display No results found.   No diagnosis found.    MDM  I have reviewed nursing notes, vital signs, and all appropriate lab and imaging results for this patient. Exam is consistent with sciatica. Pt referred to  Orthopedics. Injection of morphine and po toradol given in ED. Rx for robaxin, norco, and mobic given.       Kathie Dike, [place] 02/19/12 [phone]

## 2012-02-19 NOTE — ED Notes (Signed)
Pt c/o pain in lower back radiating down r leg x 3 weeks.  Denies injury.

## 2012-02-19 NOTE — ED Provider Notes (Signed)
Medical screening examination/treatment/procedure(s) were performed by non-physician practitioner and as supervising physician I was immediately available for consultation/collaboration.  Annaclaire Walsworth, MD 02/19/12 2111 

## 2012-02-26 ENCOUNTER — Encounter (HOSPITAL_COMMUNITY): Payer: Self-pay | Admitting: *Deleted

## 2012-02-26 ENCOUNTER — Emergency Department (HOSPITAL_COMMUNITY)
Admission: EM | Admit: 2012-02-26 | Discharge: 2012-02-26 | Disposition: A | Discharge status: Home / Self Care | Payer: Self-pay | Attending: Emergency Medicine | Admitting: Emergency Medicine

## 2012-02-26 ENCOUNTER — Emergency Department (HOSPITAL_COMMUNITY): Payer: Self-pay

## 2012-02-26 DIAGNOSIS — M549 Dorsalgia, unspecified: Secondary | ICD-10-CM | POA: Insufficient documentation

## 2012-02-26 DIAGNOSIS — N200 Calculus of kidney: Secondary | ICD-10-CM | POA: Insufficient documentation

## 2012-02-26 DIAGNOSIS — R109 Unspecified abdominal pain: Secondary | ICD-10-CM | POA: Insufficient documentation

## 2012-02-26 DIAGNOSIS — F17200 Nicotine dependence, unspecified, uncomplicated: Secondary | ICD-10-CM | POA: Insufficient documentation

## 2012-02-26 DIAGNOSIS — F329 Major depressive disorder, single episode, unspecified: Secondary | ICD-10-CM | POA: Insufficient documentation

## 2012-02-26 DIAGNOSIS — Z79899 Other long term (current) drug therapy: Secondary | ICD-10-CM | POA: Insufficient documentation

## 2012-02-26 DIAGNOSIS — F3289 Other specified depressive episodes: Secondary | ICD-10-CM | POA: Insufficient documentation

## 2012-02-26 HISTORY — DX: Dorsalgia, unspecified: M54.9

## 2012-02-26 LAB — URINE MICROSCOPIC-ADD ON

## 2012-02-26 LAB — URINALYSIS, ROUTINE W REFLEX MICROSCOPIC
Glucose, UA: NEGATIVE mg/dL
Ketones, ur: NEGATIVE mg/dL
Leukocytes, UA: NEGATIVE
Nitrite: NEGATIVE
Specific Gravity, Urine: 1.025 (ref 1.005–1.030)
pH: 5.5 (ref 5.0–8.0)

## 2012-02-26 MED ORDER — KETOROLAC TROMETHAMINE 30 MG/ML IJ SOLN
30.0000 mg | Freq: Once | INTRAMUSCULAR | Status: AC
  Administered 2012-02-26: 30 mg via INTRAVENOUS
  Filled 2012-02-26: qty 1

## 2012-02-26 MED ORDER — ONDANSETRON HCL 4 MG/2ML IJ SOLN
4.0000 mg | Freq: Once | INTRAMUSCULAR | Status: AC
  Administered 2012-02-26: 4 mg via INTRAVENOUS
  Filled 2012-02-26: qty 2

## 2012-02-26 MED ORDER — HYDROMORPHONE HCL PF 1 MG/ML IJ SOLN
1.0000 mg | Freq: Once | INTRAMUSCULAR | Status: AC
  Administered 2012-02-26: 1 mg via INTRAVENOUS
  Filled 2012-02-26: qty 1

## 2012-02-26 MED ORDER — SODIUM CHLORIDE 0.9 % IV BOLUS (SEPSIS)
500.0000 mL | Freq: Once | INTRAVENOUS | Status: AC
  Administered 2012-02-26: 500 mL via INTRAVENOUS

## 2012-02-26 MED ORDER — PROMETHAZINE HCL 25 MG PO TABS: 25.0000 mg | ORAL_TABLET | Freq: Four times a day (QID) | ORAL | Status: DC | PRN

## 2012-02-26 MED ORDER — OXYCODONE-ACETAMINOPHEN 5-325 MG PO TABS: 2.0000 | ORAL_TABLET | ORAL | Status: DC | PRN

## 2012-02-26 NOTE — ED Notes (Signed)
Pt states that he has had lower back pain that radiates down his right leg and makes his heel numb for about 3 months, states today the pain in his lower back is worse and now he is passing blood in his urine since this morning.

## 2012-02-26 NOTE — ED Notes (Signed)
Rt back pain with radiation down rt leg, for 3 mos.  Today having hematuria.  No NVD

## 2012-02-26 NOTE — ED Provider Notes (Signed)
History     CSN: [id number]  Arrival date & time 02/26/12  1504   First MD Initiated Contact with Patient 02/26/12 1534      Chief Complaint  Patient presents with  . Hematuria    (Consider location/radiation/quality/duration/timing/severity/associated sxs/prior treatment) HPI...hematuria since this morning with associated right flank pain.  Previous history of kidney stones. Severity is moderate.  Patient also has radicular pain down the right leg for 3 months. Movement makes the pain worse.  Severity is moderate.   No fever, chills, dysuria  Past Medical History  Diagnosis Date  . Depression   . Back pain     Past Surgical History  Procedure Date  . Foot surgery     History reviewed. No pertinent family history.  History  Substance Use Topics  . Smoking status: Current Every Day Smoker    Types: Cigarettes  . Smokeless tobacco: Not on file  . Alcohol Use: No      Review of Systems  All other systems reviewed and are negative.    Allergies  Review of patient's allergies indicates no known allergies.  Home Medications   Current Outpatient Rx  Name  Route  Sig  Dispense  Refill  . ACETAMINOPHEN 325 MG PO TABS   Oral   Take 650 mg by mouth every 6 (six) hours as needed. Pain         . GOODY HEADACHE PO   Oral   Take 1 packet by mouth daily as needed. Taken as needed for pain atleast 3 times within the past week         . DIAZEPAM 10 MG PO TABS   Oral   Take 10 mg by mouth every 12 (twelve) hours as needed. Anxiety         . FLUOXETINE HCL 10 MG PO CAPS   Oral   Take 10 mg by mouth daily.         Marland Kitchen NAPROXEN SODIUM 220 MG PO TABS   Oral   Take 220 mg by mouth daily as needed. For pain within the past week         . MELOXICAM 7.5 MG PO TABS      1 po bid with food         . OXYCODONE-ACETAMINOPHEN 5-325 MG PO TABS   Oral   Take 2 tablets by mouth every 4 (four) hours as needed for pain.   25 tablet   0   . PROMETHAZINE HCL 25 MG  PO TABS   Oral   Take 1 tablet (25 mg total) by mouth every 6 (six) hours as needed for nausea.   20 tablet   0     BP 134/72  Pulse 82  Temp 97.7 F (36.5 C) (Oral)  Resp 20  Ht 6\' 1"  (1.854 m)  Wt 175 lb (79.379 kg)  BMI 23.09 kg/m2  SpO2 100%  Physical Exam  Nursing note and vitals reviewed. Constitutional: He is oriented to person, place, and time. He appears well-developed and well-nourished.  HENT:  Head: Normocephalic and atraumatic.  Eyes: Conjunctivae normal and EOM are normal. Pupils are equal, round, and reactive to light.  Neck: Normal range of motion. Neck supple.  Cardiovascular: Normal rate, regular rhythm and normal heart sounds.   Pulmonary/Chest: Effort normal and breath sounds normal.  Abdominal: Soft. Bowel sounds are normal.  Genitourinary:       Tender right flank  Musculoskeletal: Normal range of motion.  Pain with straight leg raise on right  Neurological: He is alert and oriented to person, place, and time.  Skin: Skin is warm and dry.  Psychiatric: He has a normal mood and affect.    ED Course  Procedures (including critical care time)  Labs Reviewed  URINALYSIS, ROUTINE W REFLEX MICROSCOPIC - Abnormal; Notable for the following:    Color, Urine AMBER (*)  BIOCHEMICALS MAY BE AFFECTED BY COLOR   APPearance HAZY (*)     Hgb urine dipstick LARGE (*)     Protein, ur TRACE (*)     All other components within normal limits  URINE MICROSCOPIC-ADD ON   Ct Abdomen Pelvis Wo Contrast  02/26/2012  *RADIOLOGY REPORT*  Clinical Data: Gross hematuria since earlier today.  Right flank pain  CT ABDOMEN AND PELVIS WITHOUT CONTRAST  Technique:  Multidetector CT imaging of the abdomen and pelvis was performed following the standard protocol without intravenous contrast.  Comparison: None.  Findings: No visible right or left nephrolithiasis.  Right hydronephrosis and hydroureter are noted. Two separate right ureteral calculi are present: - Right mid  ureter, 3 mm, image 48. - Right distal ureter, 2 mm, just above UVJ, image 68.  Unremarkable liver, spleen, kidneys, pancreas, and gallbladder. Negative retroperitoneal vascular structures.  No stomach, small bowel, or colonic obstruction or inflammatory process.  No appendiceal inflammation.  Lung bases clear with normal appearing cardiac structures.  Degenerative disc disease L5-S1.  Unremarkable bladder, prostate, and seminal vesicles.  Scattered phleboliths.  IMPRESSION: Right hydronephrosis and hydroureter secondary to right ureteral calculi.  See comments above.  No nephrolithiasis.   Original Report Authenticated By: Davonna Belling, M.D.      1. Kidney stone on right side   2. Back pain       MDM  CT scan positive for kidney stones 2 and 3 mm.  No acute abdomen. Radicular pain down right leg is chronic. Patient has followup on Monday with orthopedics.   Discharge him with Percocet #25 and 25 mg #20        Donnetta Hutching, MD 02/26/12 1955

## 2012-03-13 ENCOUNTER — Other Ambulatory Visit (HOSPITAL_COMMUNITY): Payer: Self-pay | Admitting: Orthopaedic Surgery

## 2012-03-13 DIAGNOSIS — IMO0002 Reserved for concepts with insufficient information to code with codable children: Secondary | ICD-10-CM

## 2012-03-16 ENCOUNTER — Encounter (HOSPITAL_COMMUNITY): Payer: Self-pay

## 2012-03-16 ENCOUNTER — Ambulatory Visit (HOSPITAL_COMMUNITY)
Admission: RE | Admit: 2012-03-16 | Discharge: 2012-03-16 | Disposition: A | Discharge status: Home / Self Care | Payer: Self-pay | Source: Ambulatory Visit | Attending: Orthopaedic Surgery | Admitting: Orthopaedic Surgery

## 2012-03-16 DIAGNOSIS — IMO0002 Reserved for concepts with insufficient information to code with codable children: Secondary | ICD-10-CM

## 2012-03-16 DIAGNOSIS — M545 Low back pain, unspecified: Secondary | ICD-10-CM | POA: Insufficient documentation

## 2012-03-16 DIAGNOSIS — R209 Unspecified disturbances of skin sensation: Secondary | ICD-10-CM | POA: Insufficient documentation

## 2012-03-16 DIAGNOSIS — M5126 Other intervertebral disc displacement, lumbar region: Secondary | ICD-10-CM | POA: Insufficient documentation

## 2013-10-13 ENCOUNTER — Encounter (HOSPITAL_COMMUNITY): Payer: Self-pay | Admitting: Emergency Medicine

## 2013-10-13 ENCOUNTER — Emergency Department (HOSPITAL_COMMUNITY)
Admission: EM | Admit: 2013-10-13 | Discharge: 2013-10-13 | Disposition: A | Discharge status: Home / Self Care | Payer: Self-pay | Attending: Emergency Medicine | Admitting: Emergency Medicine

## 2013-10-13 DIAGNOSIS — IMO0002 Reserved for concepts with insufficient information to code with codable children: Secondary | ICD-10-CM | POA: Insufficient documentation

## 2013-10-13 DIAGNOSIS — M545 Low back pain, unspecified: Secondary | ICD-10-CM | POA: Insufficient documentation

## 2013-10-13 DIAGNOSIS — F17200 Nicotine dependence, unspecified, uncomplicated: Secondary | ICD-10-CM | POA: Insufficient documentation

## 2013-10-13 DIAGNOSIS — M79609 Pain in unspecified limb: Secondary | ICD-10-CM | POA: Insufficient documentation

## 2013-10-13 DIAGNOSIS — G8929 Other chronic pain: Secondary | ICD-10-CM | POA: Insufficient documentation

## 2013-10-13 DIAGNOSIS — Z791 Long term (current) use of non-steroidal anti-inflammatories (NSAID): Secondary | ICD-10-CM | POA: Insufficient documentation

## 2013-10-13 DIAGNOSIS — Z79899 Other long term (current) drug therapy: Secondary | ICD-10-CM | POA: Insufficient documentation

## 2013-10-13 DIAGNOSIS — F319 Bipolar disorder, unspecified: Secondary | ICD-10-CM | POA: Insufficient documentation

## 2013-10-13 DIAGNOSIS — M5416 Radiculopathy, lumbar region: Secondary | ICD-10-CM

## 2013-10-13 DIAGNOSIS — Z9889 Other specified postprocedural states: Secondary | ICD-10-CM | POA: Insufficient documentation

## 2013-10-13 HISTORY — DX: Bipolar disorder, unspecified: F31.9

## 2013-10-13 MED ORDER — CYCLOBENZAPRINE HCL 10 MG PO TABS: 10.0000 mg | ORAL_TABLET | Freq: Three times a day (TID) | ORAL | Status: DC | PRN

## 2013-10-13 MED ORDER — CYCLOBENZAPRINE HCL 10 MG PO TABS
10.0000 mg | ORAL_TABLET | Freq: Once | ORAL | Status: AC
  Administered 2013-10-13: 10 mg via ORAL
  Filled 2013-10-13: qty 1

## 2013-10-13 MED ORDER — PREDNISONE 10 MG PO TABS: ORAL_TABLET | ORAL | Status: DC

## 2013-10-13 MED ORDER — OXYCODONE-ACETAMINOPHEN 5-325 MG PO TABS: 1.0000 | ORAL_TABLET | ORAL | Status: DC | PRN

## 2013-10-13 MED ORDER — OXYCODONE-ACETAMINOPHEN 5-325 MG PO TABS
1.0000 | ORAL_TABLET | Freq: Once | ORAL | Status: AC
  Administered 2013-10-13: 1 via ORAL
  Filled 2013-10-13: qty 1

## 2013-10-13 NOTE — ED Provider Notes (Signed)
CSN: [id number]     Arrival date & time 10/13/13  2223 History   First MD Initiated Contact with Patient 10/13/13 2241     Chief Complaint  Patient presents with  . Back Pain  . Leg Pain     (Consider location/radiation/quality/duration/timing/severity/associated sxs/prior Treatment) HPI   [name] is a 43 y.o. male who presents to the Emergency Department complaining of worsening of his chronic low back pain.  Patient reports persistant low back pain for nearly one year, but states pain has been increasing in severity for 2 weeks.  He reports sharp, burning pain from his lower back radiating into his buttocks and both upper legs with the left worse than right.  He has been taking tylenol w/o relief.  He c/o intermittent tingling sensation to his legs and feet that resolves when he pulls his right leg up to his chest.  He denies incontinence of bladder or bowel, abdominal pain, fever, dysuria, or recent injury.  He states he does not have insurance or PMD , and does not have funds to see a specialist.      Past Medical History  Diagnosis Date  . Depression   . Back pain   . Bipolar 1 disorder    Past Surgical History  Procedure Laterality Date  . Foot surgery     History reviewed. No pertinent family history. History  Substance Use Topics  . Smoking status: Current Every Day Smoker    Types: Cigarettes  . Smokeless tobacco: Not on file  . Alcohol Use: No    Review of Systems  Constitutional: Negative for fever.  Respiratory: Negative for shortness of breath.   Gastrointestinal: Negative for vomiting, abdominal pain and constipation.  Genitourinary: Negative for dysuria, hematuria, flank pain, decreased urine volume and difficulty urinating.       No perineal numbness or incontinence of urine or feces  Musculoskeletal: Positive for back pain. Negative for joint swelling.  Skin: Negative for rash.  Neurological: Negative for weakness and numbness.  All other systems  reviewed and are negative.     Allergies  Review of patient's allergies indicates no known allergies.  Home Medications   Prior to Admission medications   Medication Sig Start Date End Date Taking? Authorizing Provider  acetaminophen (TYLENOL) 325 MG tablet Take 650 mg by mouth every 6 (six) hours as needed. Pain    Historical Provider, MD  Aspirin-Acetaminophen-Caffeine (GOODY HEADACHE PO) Take 1 packet by mouth daily as needed. Taken as needed for pain atleast 3 times within the past week    Historical Provider, MD  diazepam (VALIUM) 10 MG tablet Take 10 mg by mouth every 12 (twelve) hours as needed. Anxiety    Historical Provider, MD  FLUoxetine (PROZAC) 10 MG capsule Take 10 mg by mouth daily.    Historical Provider, MD  meloxicam (MOBIC) 7.5 MG tablet 1 po bid with food 02/19/12   Kathie Dike, PA-C  naproxen sodium (ALEVE) 220 MG tablet Take 220 mg by mouth daily as needed. For pain within the past week    Historical Provider, MD  oxyCODONE-acetaminophen (PERCOCET) 5-325 MG per tablet Take 2 tablets by mouth every 4 (four) hours as needed for pain. 02/26/12   Donnetta Hutching, MD  promethazine (PHENERGAN) 25 MG tablet Take 1 tablet (25 mg total) by mouth every 6 (six) hours as needed for nausea. 02/26/12   Donnetta Hutching, MD   BP 125/79  Pulse 79  Temp(Src) 98.7 F (37.1 C)  Resp  20  Ht  (1.854 m)  Wt 175 lb (79.379 kg)  BMI 23.09 kg/m2  SpO2 100%  Physical Exam  Nursing note and vitals reviewed. Constitutional: He is oriented to person, place, and time. He appears well-developed and well-nourished. No distress.  HENT:  Head: Normocephalic and atraumatic.  Neck: Normal range of motion. Neck supple.  Cardiovascular: Normal rate, regular rhythm, normal heart sounds and intact distal pulses.   No murmur heard. Pulmonary/Chest: Effort normal and breath sounds normal. No respiratory distress. He exhibits no tenderness.  Abdominal: Soft. He exhibits no distension and no mass. There is  no tenderness. There is no rebound and no guarding.  Musculoskeletal: He exhibits tenderness. He exhibits no edema.       Lumbar back: He exhibits tenderness and pain. He exhibits normal range of motion, no swelling, no deformity, no laceration and normal pulse.  ttp of the lumbar spine, bilateral paraspinal muscles and SI joints.  No spinal tenderness.  DP pulses are brisk and symmetrical.  Distal sensation intact.  Hip Flexors/Extensors are intact.  Pt has 5/5 strength against resistance of bilateral lower extremities.     Neurological: He is alert and oriented to person, place, and time. He has normal strength. No sensory deficit. He exhibits normal muscle tone. Coordination and gait normal.  Reflex Scores:      Patellar reflexes are 2+ on the right side and 2+ on the left side.      Achilles reflexes are 2+ on the right side and 2+ on the left side. Skin: Skin is warm and dry. No rash noted.    ED Course  Procedures (including critical care time) Labs Review Labs Reviewed - No data to display  Imaging Review No results found.   EKG Interpretation None      MDM   Final diagnoses:  Lumbar radicular pain    Patient reviewed on [place] narcotics database with no medications on file.    Patient had MRI of L spine from January 2014 that shows a leftward disc herniation and facet hypertrophy at L5 with mild foraminal narrowing bilaterally, worse on the left.  Patient is ambulatory with steady gait, no focal neuro deficits on exam.  No concerning sx's for emergent neurological or infectious process. He agrees to symptomatic treatment with prednisone taper, percocet and flexeril.  He was given referral info for [facility] and agrees to arrange follow-up.  VSS, well appearing and stable for d/c  Lynsay Fesperman L. Trisha Mangle, PA-C 10/13/13 2335

## 2013-10-13 NOTE — ED Notes (Signed)
Pt with chronic back pain. States pain has gotten worse. Usually takes Tylenol for relief but "now that doesn't work". Pt states pain radiates down both legs and his has tingling in feet bilaterally.

## 2013-10-13 NOTE — Discharge Instructions (Signed)
Back Pain, Adult Back pain is very common. The pain often gets better over time. The cause of back pain is usually not dangerous. Most people can learn to manage their back pain on their own.  HOME CARE   Stay active. Start with short walks on flat ground if you can. Try to walk farther each day.  Do not sit, drive, or stand in one place for more than 30 minutes. Do not stay in bed.  Do not avoid exercise or work. Activity can help your back heal faster.  Be careful when you bend or lift an object. Bend at your knees, keep the object close to you, and do not twist.  Sleep on a firm mattress. Lie on your side, and bend your knees. If you lie on your back, put a pillow under your knees.  Only take medicines as told by your doctor.  Put ice on the injured area.  Put ice in a plastic bag.  Place a towel between your skin and the bag.  Leave the ice on for 15-20 minutes, 03-04 times a day for the first 2 to 3 days. After that, you can switch between ice and heat packs.  Ask your doctor about back exercises or massage.  Avoid feeling anxious or stressed. Find good ways to deal with stress, such as exercise. GET HELP RIGHT AWAY IF:   Your pain does not go away with rest or medicine.  Your pain does not go away in 1 week.  You have new problems.  You do not feel well.  The pain spreads into your legs.  You cannot control when you poop (bowel movement) or pee (urinate).  Your arms or legs feel weak or lose feeling (numbness).  You feel sick to your stomach (nauseous) or throw up (vomit).  You have belly (abdominal) pain.  You feel like you may pass out (faint). MAKE SURE YOU:   Understand these instructions.  Will watch your condition.  Will get help right away if you are not doing well or get worse. Document Released: 07/24/2007 Document Revised: 04/29/2011 Document Reviewed: 06/08/2013 [facility] Patient Information 2015 [place], [place]. This information is not intended  to replace advice given to you by your health care provider. Make sure you discuss any questions you have with your health care provider.  Lumbosacral Radiculopathy Lumbosacral radiculopathy is a pinched nerve or nerves in the low back (lumbosacral area). When this happens you may have weakness in your legs and may not be able to stand on your toes. You may have pain going down into your legs. There may be difficulties with walking normally. There are many causes of this problem. Sometimes this may happen from an injury, or simply from arthritis or boney problems. It may also be caused by other illnesses such as diabetes. If there is no improvement after treatment, further studies may be done to find the exact cause. DIAGNOSIS  X-rays may be needed if the problems become long standing. Electromyograms may be done. This study is one in which the working of nerves and muscles is studied. HOME CARE INSTRUCTIONS   Applications of ice packs may be helpful. Ice can be used in a plastic bag with a towel around it to prevent frostbite to skin. This may be used every 2 hours for 20 to 30 minutes, or as needed, while awake, or as directed by your caregiver.  Only take over-the-counter or prescription medicines for pain, discomfort, or fever as directed by your caregiver.  If physical therapy was prescribed, follow your caregiver's directions. SEEK IMMEDIATE MEDICAL CARE IF:   You have pain not controlled with medications.  You seem to be getting worse rather than better.  You develop increasing weakness in your legs.  You develop loss of bowel or bladder control.  You have difficulty with walking or balance, or develop clumsiness in the use of your legs.  You have a fever. MAKE SURE YOU:   Understand these instructions.  Will watch your condition.  Will get help right away if you are not doing well or get worse. Document Released: 02/04/2005 Document Revised: 04/29/2011 Document Reviewed:  09/25/2007 [facility] Patient Information 2015 [place], [place]. This information is not intended to replace advice given to you by your health care provider. Make sure you discuss any questions you have with your health care provider.    Emergency Department Resource Guide 1) Find a Doctor and Pay Out of Pocket Although you won't have to find out who is covered by your insurance plan, it is a good idea to ask around and get recommendations. You will then need to call the office and see if the doctor you have chosen will accept you as a new patient and what types of options they offer for patients who are self-pay. Some doctors offer discounts or will set up payment plans for their patients who do not have insurance, but you will need to ask so you aren't surprised when you get to your appointment.  2) Contact Your Local Health Department Not all health departments have doctors that can see patients for sick visits, but many do, so it is worth a call to see if yours does. If you don't know where your local health department is, you can check in your phone book. The CDC also has a tool to help you locate your state's health department, and many state websites also have listings of all of their local health departments.  3) Find a Walk-in Clinic If your illness is not likely to be very severe or complicated, you may want to try a walk in clinic. These are popping up all over the country in pharmacies, drugstores, and shopping centers. They're usually staffed by nurse practitioners or physician assistants that have been trained to treat common illnesses and complaints. They're usually fairly quick and inexpensive. However, if you have serious medical issues or chronic medical problems, these are probably not your best option.  No Primary Care Doctor: - Call Health Connect at  [phone] - they can help you locate a primary care doctor that  accepts your insurance, provides certain services, etc. - Physician  Referral Service- [phone]  Chronic Pain Problems: Organization         Address  Phone   Notes  [facility]  [phone] Patients need to be referred by their primary care doctor.   Medication Assistance: Organization         Address  Phone   Notes  [facility] Medication [facility] [address] [place], [place] [address] [phone] --Must be a resident of [facility] -- Must have NO insurance coverage whatsoever (no Medicaid/ Medicare, etc.) -- The pt. MUST have a primary care doctor that directs their care regularly and follows them in the community   [facility]  [phone]   [employer]  [phone]    Agencies that provide inexpensive medical care: Organization         Address  Phone   Notes  [facility]  [phone]   [facility]    [phone]   [facility] [address] [place], [place] [address] [phone]   [facility] [address] [place], [place] [phone]   Planned Parenthood    [phone]   [facility]    [phone]   [facility] and [facility]  [address], [place] Phone:  [phone], Fax:  [phone] Hours of Operation:  9 am - 6 pm, M-F.  Also accepts Medicaid/Medicare and self-pay.  [facility]  [address], [place] Phone: [phone], Fax: [phone]. Hours of Operation:  8:30 am - 5:30 pm, M-F.  Also accepts Medicaid and self-pay.  [facility] [place] [address], [place] Phone: [phone]   [facility] [address] Natasha Bence [place], [place] [phone], Ext. [phone] Mondays & Thursdays: 7-9 AM.  First 15 patients are seen on a first come, first serve basis.    Medicaid-accepting [employer] Providers:  Organization         Address  Phone   Notes  [facility] [address],  [phone] Also accepts self-pay patients.  [facility] [address] Laurell Josephs [place], [place]  [phone]   [facility] [address], [place] [phone]   [facility] [address] [place] [phone]   Renaye Rakers [address], [place]   [phone] Only accepts [place] Access [place] patients after they have their name applied to their card.   Self-Pay (no insurance) in [facility]:  Organization         Address  Phone   Notes  Sickle Cell Patients, [facility] [address] [place], [place] [phone]   [facility] [address] [place], [place] [phone]   [facility] [place]  [address], [place] [phone]   [facility]/Dr. Osei-Bonsu  [address], [place] or [address], [place] [phone] Phone number for both [place] and [place] locations is the same.  [facility]gent [facility] and [facility] [address], [place] [phone]   [facility] [address] [place] or [address] [phone]   [facility] [address], [place] [phone], phone; [phone], fax Sees patients 1st and 3rd Saturday of every month.  Must not qualify for public or private insurance (i.e. Medicaid, Medicare, [place] Health Choice, Veterans' Benefits)  Household income should be no more than 200% of the poverty level The clinic cannot treat you if you are pregnant or think you are pregnant  Sexually transmitted diseases are not treated at the clinic.    Dental Care: Organization         Address  Phone  Notes  [facility] [address] [place], [place] [phone] Accepts children up to age 28 who are enrolled  in [place] or [place] Health Choice; pregnant women with a Medicaid card; and children who have applied for Medicaid or [place] Health Choice, but were declined, whose parents can pay a reduced fee at time of service.  [facility] of [facility]  [address], [place] [phone] Accepts children up to age 64 who are enrolled in [place] or [place] Health Choice; pregnant women with a Medicaid card; and children who have applied for Medicaid or [place] Health Choice, but were declined, whose parents can pay a reduced fee at time of service.  [facility]  [address] [place], [place] [phone] Patients are seen by appointment only. Walk-ins are not accepted. [facility] will see patients 49 years of age and older. Monday - Tuesday (8am-5pm) Most Wednesdays (8:30-5pm) $30 per visit, cash only  [facility] Adult Dental Access PROGRAM  [address], [facility] [phone] Patients are seen by appointment only. Walk-ins are not accepted. [facility] will see patients 28 years of age and older. One Wednesday Evening (Monthly: Volunteer Based).  $30 per visit, cash only  [employer]  [phone] for adults; Children under age 21, call Graduate Pediatric Dentistry at [phone]. Children aged 62-14, please call [phone] to request a pediatric application.  Dental services are provided in all areas of dental care including fillings, crowns and bridges, complete and partial dentures, implants, gum treatment, root canals, and extractions. Preventive care is also provided. Treatment is provided to both adults and children. Patients are selected via a lottery and there is often a waiting list.   [facility] [address], [place]  [phone] [email or web address]   [facility] [address] [place], [place] [phone], Ext. [phone] Second and Fourth Thursday of each month, opens at  6:30 AM; Clinic ends at 9 AM.  Patients are seen on a first-come first-served basis, and a limited number are seen during each clinic.   [facility]  [address] Ether Griffins [place], [place] [phone]   Eligibility Requirements You must have lived in [place], [place], or [place] counties for at least the last three months.   You cannot be eligible for state or federal sponsored [place], including [employer], [place], or [employer].   You generally cannot be eligible for healthcare insurance through your employer.    How to apply: Eligibility screenings are held every Tuesday and Wednesday afternoon from 1:00 pm until 4:00 pm. You do not need an appointment for the interview!  [facility] [address], [place], [place] [phone]   [facility]  [phone]   [facility] Health Department  [phone]   [facility]  [phone]    Behavioral Health Resources in the Community: Intensive Outpatient Programs Organization         Address  Phone  Notes  [facility] [address] [place], [place] [phone]   [facility] Outpatient [address] [place], [place] [phone]   [facility] [address] [place], [place]  [phone]   [facility] Mental Health [address]  [place], [place] [phone] or [phone]   Substance Abuse Resources Organization         Address  Phone  Notes  [facility]  [phone]   [facility]  [phone]   [facility] [place]  [phone]   Floydene Flock  [phone]   Residential & Outpatient Substance Abuse Program  [phone]   Psychological Services Organization         Address  Phone  Notes  [facility]  [phone]   [facility] Services  [phone]   [facility] Mental Health [address] [place]  [phone] or [phone]    Mobile Crisis Teams Organization         Address  Phone  Notes  [facility], Mobile Crisis Care Unit  [phone]   [facility]  [address] [place] [address] [place], [place] [phone]   Doristine Locks [address] [place] [phone]    Self-Help/Support Groups Organization         Address  Phone             Notes  [facility] [place] - variety of support groups  336- I7437963 Call for more information  Narcotics Anonymous (NA), [facility] [address], [employer] [place]  2 meetings at this location   Statistician         Address  Phone  Notes  [facility] [address],    [place]  [phone]   [facility]  [address] [place] [address], [place], [place] [phone]   [facility] [address] [place], [place] [phone] Admissions: 8am-3pm M-F  [facility] [address]    [place], [place] [phone]   [facility] [address] [place], [place], [place] [phone]   [facility] [address]  [place], [place] [phone]   [facility] - Intensive Outpatient [address] Laurell Josephs [address], [place], [place] [phone]   [facility] ([facility] [address] [place]  [place], [place] [phone] or [phone]   [facility] ([facility]) [address] [place], [place] [phone] Accepts Medicaid  [facility] [address],  [place] [phone] Substance Abuse/Addiction Treatment   [facility] Organization         Address  Phone  Notes  [facility]  [phone]   Angie Fava, PhD [address] Ervin Knack [place], [place]   [phone] or [phone]   [facility]   [address] [place], [place] [phone]   [facility]  [address], [place], [place] [phone] Insurance/Medicaid/sponsorship through [facility] Families [address]                                    [place], [place] [phone] Therapy/tele-psych/case  [facility] [address], [place] [phone]    Dr. Lolly Mustache  [phone]   [facility] [place]  [employer] [facility] Dept. 1) [address], [place] 2) [address], [place] 3)  [address], [place] [phone]  [phone]   [facility] Child Abuse Hotline 463 518 3743 or 248-517-0814 (After Hours)

## 2013-10-13 NOTE — ED Notes (Signed)
PA at bedside.

## 2013-10-16 NOTE — ED Provider Notes (Signed)
Medical screening examination/treatment/procedure(s) were performed by non-physician practitioner and as supervising physician I was immediately available for consultation/collaboration.   EKG Interpretation None        Rolland Porter, MD 10/16/13 1008

## 2013-10-28 ENCOUNTER — Encounter (HOSPITAL_COMMUNITY): Payer: Self-pay | Admitting: Emergency Medicine

## 2013-10-28 ENCOUNTER — Emergency Department (HOSPITAL_COMMUNITY)
Admission: EM | Admit: 2013-10-28 | Discharge: 2013-10-29 | Disposition: A | Discharge status: Home / Self Care | Payer: Self-pay | Attending: Emergency Medicine | Admitting: Emergency Medicine

## 2013-10-28 DIAGNOSIS — IMO0002 Reserved for concepts with insufficient information to code with codable children: Secondary | ICD-10-CM | POA: Insufficient documentation

## 2013-10-28 DIAGNOSIS — Y9389 Activity, other specified: Secondary | ICD-10-CM | POA: Insufficient documentation

## 2013-10-28 DIAGNOSIS — F17200 Nicotine dependence, unspecified, uncomplicated: Secondary | ICD-10-CM | POA: Insufficient documentation

## 2013-10-28 DIAGNOSIS — Y9289 Other specified places as the place of occurrence of the external cause: Secondary | ICD-10-CM | POA: Insufficient documentation

## 2013-10-28 DIAGNOSIS — G8929 Other chronic pain: Secondary | ICD-10-CM | POA: Insufficient documentation

## 2013-10-28 DIAGNOSIS — X500XXA Overexertion from strenuous movement or load, initial encounter: Secondary | ICD-10-CM | POA: Insufficient documentation

## 2013-10-28 DIAGNOSIS — F319 Bipolar disorder, unspecified: Secondary | ICD-10-CM | POA: Insufficient documentation

## 2013-10-28 DIAGNOSIS — S335XXA Sprain of ligaments of lumbar spine, initial encounter: Secondary | ICD-10-CM | POA: Insufficient documentation

## 2013-10-28 DIAGNOSIS — Z79899 Other long term (current) drug therapy: Secondary | ICD-10-CM | POA: Insufficient documentation

## 2013-10-28 DIAGNOSIS — S39012A Strain of muscle, fascia and tendon of lower back, initial encounter: Secondary | ICD-10-CM

## 2013-10-28 DIAGNOSIS — M549 Dorsalgia, unspecified: Secondary | ICD-10-CM
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
## 2013-10-29 MED ORDER — OXYCODONE-ACETAMINOPHEN 5-325 MG PO TABS: 1.0000 | ORAL_TABLET | ORAL | Status: DC | PRN

## 2013-10-29 MED ORDER — CYCLOBENZAPRINE HCL 10 MG PO TABS: 10.0000 mg | ORAL_TABLET | Freq: Three times a day (TID) | ORAL | Status: DC | PRN

## 2013-10-29 MED ORDER — NAPROXEN 500 MG PO TABS: 500.0000 mg | ORAL_TABLET | Freq: Two times a day (BID) | ORAL | Status: DC

## 2013-10-29 MED ORDER — CYCLOBENZAPRINE HCL 10 MG PO TABS
10.0000 mg | ORAL_TABLET | Freq: Once | ORAL | Status: AC
  Administered 2013-10-29: 10 mg via ORAL
  Filled 2013-10-29: qty 1

## 2013-10-29 MED ORDER — KETOROLAC TROMETHAMINE 30 MG/ML IJ SOLN
30.0000 mg | Freq: Once | INTRAMUSCULAR | Status: AC
  Administered 2013-10-29: 30 mg via INTRAVENOUS
  Filled 2013-10-29: qty 1

## 2013-10-29 MED ORDER — HYDROMORPHONE HCL PF 1 MG/ML IJ SOLN
1.0000 mg | Freq: Once | INTRAMUSCULAR | Status: AC
Start: 2013-10-29 — End: 2013-10-29
  Administered 2013-10-29: 1 mg via INTRAVENOUS
  Filled 2013-10-29: qty 1

## 2013-10-29 MED ORDER — ONDANSETRON HCL 4 MG/2ML IJ SOLN
4.0000 mg | Freq: Once | INTRAMUSCULAR | Status: AC
  Administered 2013-10-29: 4 mg via INTRAVENOUS
  Filled 2013-10-29: qty 2

## 2013-10-29 NOTE — ED Notes (Signed)
Pt alert & oriented x4, stable gait. Patient given discharge instructions, paperwork & prescription(s). Patient  instructed to stop at the registration desk to finish any additional paperwork. Patient verbalized understanding. Pt left department w/ no further questions. 
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
## 2013-10-29 NOTE — Discharge Instructions (Signed)
Lumbosacral Strain Lumbosacral strain is a strain of any of the parts that make up your lumbosacral vertebrae. Your lumbosacral vertebrae are the bones that make up the lower third of your backbone. Your lumbosacral vertebrae are held together by muscles and tough, fibrous tissue (ligaments).  CAUSES  A sudden blow to your back can cause lumbosacral strain. Also, anything that causes an excessive stretch of the muscles in the low back can cause this strain. This is typically seen when people exert themselves strenuously, fall, lift heavy objects, bend, or crouch repeatedly. RISK FACTORS  Physically demanding work.  Participation in pushing or pulling sports or sports that require a sudden twist of the back (tennis, golf, baseball).  Weight lifting.  Excessive lower back curvature.  Forward-tilted pelvis.  Weak back or abdominal muscles or both.  Tight hamstrings. SIGNS AND SYMPTOMS  Lumbosacral strain may cause pain in the area of your injury or pain that moves (radiates) down your leg.  DIAGNOSIS Your health care provider can often diagnose lumbosacral strain through a physical exam. In some cases, you may need tests such as X-ray exams.  TREATMENT  Treatment for your lower back injury depends on many factors that your clinician will have to evaluate. However, most treatment will include the use of anti-inflammatory medicines. HOME CARE INSTRUCTIONS   Avoid hard physical activities (tennis, racquetball, waterskiing) if you are not in proper physical condition for it. This may aggravate or create problems.  If you have a back problem, avoid sports requiring sudden body movements. Swimming and walking are generally safer activities.  Maintain good posture.  Maintain a healthy weight.  For acute conditions, you may put ice on the injured area.  Put ice in a plastic bag.  Place a towel between your skin and the bag.  Leave the ice on for 20 minutes, 2-3 times a day.  When the  low back starts healing, stretching and strengthening exercises may be recommended. SEEK MEDICAL CARE IF:  Your back pain is getting worse.  You experience severe back pain not relieved with medicines. SEEK IMMEDIATE MEDICAL CARE IF:   You have numbness, tingling, weakness, or problems with the use of your arms or legs.  There is a change in bowel or bladder control.  You have increasing pain in any area of the body, including your belly (abdomen).  You notice shortness of breath, dizziness, or feel faint.  You feel sick to your stomach (nauseous), are throwing up (vomiting), or become sweaty.  You notice discoloration of your toes or legs, or your feet get very cold. MAKE SURE YOU:   Understand these instructions.  Will watch your condition.  Will get help right away if you are not doing well or get worse. Document Released: 11/14/2004 Document Revised: 02/09/2013 Document Reviewed: 09/23/2012 [facility] Patient Information 2015 [place], [place]. This information is not intended to replace advice given to you by your health care provider. Make sure you discuss any questions you have with your health care provider.  Cyclobenzaprine tablets What is this medicine? CYCLOBENZAPRINE (sye kloe BEN za preen) is a muscle relaxer. It is used to treat muscle pain, spasms, and stiffness. This medicine may be used for other purposes; ask your health care provider or pharmacist if you have questions. COMMON BRAND NAME(S): Fexmid, Flexeril What should I tell my health care provider before I take this medicine? They need to know if you have any of these conditions: -heart disease, irregular heartbeat, or previous heart attack -liver disease -thyroid  problem -an unusual or allergic reaction to cyclobenzaprine, tricyclic antidepressants, lactose, other medicines, foods, dyes, or preservatives -pregnant or trying to get pregnant -breast-feeding How should I use this medicine? Take this medicine  by mouth with a glass of water. Follow the directions on the prescription label. If this medicine upsets your stomach, take it with food or milk. Take your medicine at regular intervals. Do not take it more often than directed. Talk to your pediatrician regarding the use of this medicine in children. Special care may be needed. Overdosage: If you think you have taken too much of this medicine contact a poison control center or emergency room at once. NOTE: This medicine is only for you. Do not share this medicine with others. What if I miss a dose? If you miss a dose, take it as soon as you can. If it is almost time for your next dose, take only that dose. Do not take double or extra doses. What may interact with this medicine? Do not take this medicine with any of the following medications: -certain medicines for fungal infections like fluconazole, itraconazole, ketoconazole, posaconazole, voriconazole -cisapride -dofetilide -dronedarone -droperidol -flecainide -grepafloxacin -halofantrine -levomethadyl -MAOIs like Carbex, Eldepryl, Marplan, Nardil, and Parnate -nilotinib -pimozide -probucol -sertindole -thioridazine -ziprasidone This medicine may also interact with the following medications: -abarelix -alcohol -certain medicines for cancer -certain medicines for depression, anxiety, or psychotic disturbances -certain medicines for infection like alfuzosin, chloroquine, clarithromycin, levofloxacin, mefloquine, pentamidine, troleandomycin -certain medicines for an irregular heart beat -certain medicines used for sleep or numbness during surgery or procedure -contrast dyes -dolasetron -guanethidine -methadone -octreotide -ondansetron -other medicines that prolong the QT interval (cause an abnormal heart rhythm) -palonosetron -phenothiazines like chlorpromazine, mesoridazine, prochlorperazine, thioridazine -tramadol -vardenafil This list may not describe all possible  interactions. Give your health care provider a list of all the medicines, herbs, non-prescription drugs, or dietary supplements you use. Also tell them if you smoke, drink alcohol, or use illegal drugs. Some items may interact with your medicine. What should I watch for while using this medicine? Check with your doctor or health care professional if your condition does not improve within 1 to 3 weeks. You may get drowsy or dizzy when you first start taking the medicine or change doses. Do not drive, use machinery, or do anything that may be dangerous until you know how the medicine affects you. Stand or sit up slowly. Your mouth may get dry. Drinking water, chewing sugarless gum, or sucking on hard candy may help. What side effects may I notice from receiving this medicine? Side effects that you should report to your doctor or health care professional as soon as possible: -allergic reactions like skin rash, itching or hives, swelling of the face, lips, or tongue -chest pain -fast heartbeat -hallucinations -seizures -vomiting Side effects that usually do not require medical attention (report to your doctor or health care professional if they continue or are bothersome): -headache This list may not describe all possible side effects. Call your doctor for medical advice about side effects. You may report side effects to FDA at 1-800-FDA-1088. Where should I keep my medicine? Keep out of the reach of children. Store at room temperature between 15 and 30 degrees C (59 and 86 degrees F). Keep container tightly closed. Throw away any unused medicine after the expiration date. NOTE: This sheet is a summary. It may not cover all possible information. If you have questions about this medicine, talk to your doctor, pharmacist, or health care provider.  2015, Elsevier/Gold Standard. (2012-09-01 12:48:19)  Naproxen and naproxen sodium oral immediate-release tablets What is this medicine? NAPROXEN (na  PROX en) is a non-steroidal anti-inflammatory drug (NSAID). It is used to reduce swelling and to treat pain. This medicine may be used for dental pain, headache, or painful monthly periods. It is also used for painful joint and muscular problems such as arthritis, tendinitis, bursitis, and gout. This medicine may be used for other purposes; ask your health care provider or pharmacist if you have questions. COMMON BRAND NAME(S): Aflaxen, Aleve, Aleve Arthritis, All Day Relief, Anaprox, Anaprox DS, Naprosyn What should I tell my health care provider before I take this medicine? They need to know if you have any of these conditions: -asthma -cigarette smoker -drink more than 3 alcohol containing drinks a day -heart disease or circulation problems such as heart failure or leg edema (fluid retention) -high blood pressure -kidney disease -liver disease -stomach bleeding or ulcers -an unusual or allergic reaction to naproxen, aspirin, other NSAIDs, other medicines, foods, dyes, or preservatives -pregnant or trying to get pregnant -breast-feeding How should I use this medicine? Take this medicine by mouth with a glass of water. Follow the directions on the prescription label. Take it with food if your stomach gets upset. Try to not lie down for at least 10 minutes after you take it. Take your medicine at regular intervals. Do not take your medicine more often than directed. Long-term, continuous use may increase the risk of heart attack or stroke. A special MedGuide will be given to you by the pharmacist with each prescription and refill. Be sure to read this information carefully each time. Talk to your pediatrician regarding the use of this medicine in children. Special care may be needed. Overdosage: If you think you have taken too much of this medicine contact a poison control center or emergency room at once. NOTE: This medicine is only for you. Do not share this medicine with others. What if I  miss a dose? If you miss a dose, take it as soon as you can. If it is almost time for your next dose, take only that dose. Do not take double or extra doses. What may interact with this medicine? -alcohol -aspirin -cidofovir -diuretics -lithium -methotrexate -other drugs for inflammation like ketorolac or prednisone -pemetrexed -probenecid -warfarin This list may not describe all possible interactions. Give your health care provider a list of all the medicines, herbs, non-prescription drugs, or dietary supplements you use. Also tell them if you smoke, drink alcohol, or use illegal drugs. Some items may interact with your medicine. What should I watch for while using this medicine? Tell your doctor or health care professional if your pain does not get better. Talk to your doctor before taking another medicine for pain. Do not treat yourself. This medicine does not prevent heart attack or stroke. In fact, this medicine may increase the chance of a heart attack or stroke. The chance may increase with longer use of this medicine and in people who have heart disease. If you take aspirin to prevent heart attack or stroke, talk with your doctor or health care professional. Do not take other medicines that contain aspirin, ibuprofen, or naproxen with this medicine. Side effects such as stomach upset, nausea, or ulcers may be more likely to occur. Many medicines available without a prescription should not be taken with this medicine. This medicine can cause ulcers and bleeding in the stomach and intestines at any time during treatment. Do not  smoke cigarettes or drink alcohol. These increase irritation to your stomach and can make it more susceptible to damage from this medicine. Ulcers and bleeding can happen without warning symptoms and can cause death. You may get drowsy or dizzy. Do not drive, use machinery, or do anything that needs mental alertness until you know how this medicine affects you. Do not  stand or sit up quickly, especially if you are an older patient. This reduces the risk of dizzy or fainting spells. This medicine can cause you to bleed more easily. Try to avoid damage to your teeth and gums when you brush or floss your teeth. What side effects may I notice from receiving this medicine? Side effects that you should report to your doctor or health care professional as soon as possible: -black or bloody stools, blood in the urine or vomit -blurred vision -chest pain -difficulty breathing or wheezing -nausea or vomiting -severe stomach pain -skin rash, skin redness, blistering or peeling skin, hives, or itching -slurred speech or weakness on one side of the body -swelling of eyelids, throat, lips -unexplained weight gain or swelling -unusually weak or tired -yellowing of eyes or skin Side effects that usually do not require medical attention (report to your doctor or health care professional if they continue or are bothersome): -constipation -headache -heartburn This list may not describe all possible side effects. Call your doctor for medical advice about side effects. You may report side effects to FDA at 1-800-FDA-1088. Where should I keep my medicine? Keep out of the reach of children. Store at room temperature between 15 and 30 degrees C (59 and 86 degrees F). Keep container tightly closed. Throw away any unused medicine after the expiration date. NOTE: This sheet is a summary. It may not cover all possible information. If you have questions about this medicine, talk to your doctor, pharmacist, or health care provider.  2015, Elsevier/Gold Standard. (2009-02-06 20:10:16)  Acetaminophen; Oxycodone tablets What is this medicine? ACETAMINOPHEN; OXYCODONE (a set a MEE noe fen; ox i KOE done) is a pain reliever. It is used to treat mild to moderate pain. This medicine may be used for other purposes; ask your health care provider or pharmacist if you have  questions. COMMON BRAND NAME(S): Endocet, Magnacet, Narvox, Percocet, Perloxx, Primalev, Primlev, Roxicet, Xolox What should I tell my health care provider before I take this medicine? They need to know if you have any of these conditions: -brain tumor -Crohn's disease, inflammatory bowel disease, or ulcerative colitis -drug abuse or addiction -head injury -heart or circulation problems -if you often drink alcohol -kidney disease or problems going to the bathroom -liver disease -lung disease, asthma, or breathing problems -an unusual or allergic reaction to acetaminophen, oxycodone, other opioid analgesics, other medicines, foods, dyes, or preservatives -pregnant or trying to get pregnant -breast-feeding How should I use this medicine? Take this medicine by mouth with a full glass of water. Follow the directions on the prescription label. Take your medicine at regular intervals. Do not take your medicine more often than directed. Talk to your pediatrician regarding the use of this medicine in children. Special care may be needed. Patients over 23 years old may have a stronger reaction and need a smaller dose. Overdosage: If you think you have taken too much of this medicine contact a poison control center or emergency room at once. NOTE: This medicine is only for you. Do not share this medicine with others. What if I miss a dose? If you miss a  dose, take it as soon as you can. If it is almost time for your next dose, take only that dose. Do not take double or extra doses. What may interact with this medicine? -alcohol -antihistamines -barbiturates like amobarbital, butalbital, butabarbital, methohexital, pentobarbital, phenobarbital, thiopental, and secobarbital -benztropine -drugs for bladder problems like solifenacin, trospium, oxybutynin, tolterodine, hyoscyamine, and methscopolamine -drugs for breathing problems like ipratropium and tiotropium -drugs for certain stomach or  intestine problems like propantheline, homatropine methylbromide, glycopyrrolate, atropine, belladonna, and dicyclomine -general anesthetics like etomidate, ketamine, nitrous oxide, propofol, desflurane, enflurane, halothane, isoflurane, and sevoflurane -medicines for depression, anxiety, or psychotic disturbances -medicines for sleep -muscle relaxants -naltrexone -narcotic medicines (opiates) for pain -phenothiazines like perphenazine, thioridazine, chlorpromazine, mesoridazine, fluphenazine, prochlorperazine, promazine, and trifluoperazine -scopolamine -tramadol -trihexyphenidyl This list may not describe all possible interactions. Give your health care provider a list of all the medicines, herbs, non-prescription drugs, or dietary supplements you use. Also tell them if you smoke, drink alcohol, or use illegal drugs. Some items may interact with your medicine. What should I watch for while using this medicine? Tell your doctor or health care professional if your pain does not go away, if it gets worse, or if you have new or a different type of pain. You may develop tolerance to the medicine. Tolerance means that you will need a higher dose of the medication for pain relief. Tolerance is normal and is expected if you take this medicine for a long time. Do not suddenly stop taking your medicine because you may develop a severe reaction. Your body becomes used to the medicine. This does NOT mean you are addicted. Addiction is a behavior related to getting and using a drug for a non-medical reason. If you have pain, you have a medical reason to take pain medicine. Your doctor will tell you how much medicine to take. If your doctor wants you to stop the medicine, the dose will be slowly lowered over time to avoid any side effects. You may get drowsy or dizzy. Do not drive, use machinery, or do anything that needs mental alertness until you know how this medicine affects you. Do not stand or sit up  quickly, especially if you are an older patient. This reduces the risk of dizzy or fainting spells. Alcohol may interfere with the effect of this medicine. Avoid alcoholic drinks. There are different types of narcotic medicines (opiates) for pain. If you take more than one type at the same time, you may have more side effects. Give your health care provider a list of all medicines you use. Your doctor will tell you how much medicine to take. Do not take more medicine than directed. Call emergency for help if you have problems breathing. The medicine will cause constipation. Try to have a bowel movement at least every 2 to 3 days. If you do not have a bowel movement for 3 days, call your doctor or health care professional. Do not take Tylenol (acetaminophen) or medicines that have acetaminophen with this medicine. Too much acetaminophen can be very dangerous. Many nonprescription medicines contain acetaminophen. Always read the labels carefully to avoid taking more acetaminophen. What side effects may I notice from receiving this medicine? Side effects that you should report to your doctor or health care professional as soon as possible: -allergic reactions like skin rash, itching or hives, swelling of the face, lips, or tongue -breathing difficulties, wheezing -confusion -light headedness or fainting spells -severe stomach pain -unusually weak or tired -yellowing of the skin  or the whites of the eyes Side effects that usually do not require medical attention (report to your doctor or health care professional if they continue or are bothersome): -dizziness -drowsiness -nausea -vomiting This list may not describe all possible side effects. Call your doctor for medical advice about side effects. You may report side effects to FDA at 1-800-FDA-1088. Where should I keep my medicine? Keep out of the reach of children. This medicine can be abused. Keep your medicine in a safe place to protect it from  theft. Do not share this medicine with anyone. Selling or giving away this medicine is dangerous and against the law. Store at room temperature between 20 and 25 degrees C (68 and 77 degrees F). Keep container tightly closed. Protect from light. This medicine may cause accidental overdose and death if it is taken by other adults, children, or pets. Flush any unused medicine down the toilet to reduce the chance of harm. Do not use the medicine after the expiration date. NOTE: This sheet is a summary. It may not cover all possible information. If you have questions about this medicine, talk to your doctor, pharmacist, or health care provider.  2015, Elsevier/Gold Standard. (2012-09-28 13:17:35)   Emergency Department Resource Guide 1) Find a Doctor and Pay Out of Pocket Although you won't have to find out who is covered by your insurance plan, it is a good idea to ask around and get recommendations. You will then need to call the office and see if the doctor you have chosen will accept you as a new patient and what types of options they offer for patients who are self-pay. Some doctors offer discounts or will set up payment plans for their patients who do not have insurance, but you will need to ask so you aren't surprised when you get to your appointment.  2) Contact Your Local Health Department Not all health departments have doctors that can see patients for sick visits, but many do, so it is worth a call to see if yours does. If you don't know where your local health department is, you can check in your phone book. The CDC also has a tool to help you locate your state's health department, and many state websites also have listings of all of their local health departments.  3) Find a Walk-in Clinic If your illness is not likely to be very severe or complicated, you may want to try a walk in clinic. These are popping up all over the country in pharmacies, drugstores, and shopping centers. They're  usually staffed by nurse practitioners or physician assistants that have been trained to treat common illnesses and complaints. They're usually fairly quick and inexpensive. However, if you have serious medical issues or chronic medical problems, these are probably not your best option.  No Primary Care Doctor: - Call [facility] at  [phone] - they can help you locate a primary care doctor that  accepts your insurance, provides certain services, etc. - Physician Referral Service- [phone]  Chronic Pain Problems: Organization         Address  Phone   Notes  [facility]  [phone] Patients need to be referred by their primary care doctor.   Medication Assistance: Organization         Address  Phone   Notes  [facility] Medication [facility] [address] [place], [place] [address] [phone] --Must be a resident of [facility] -- Must have  NO insurance coverage whatsoever (no Medicaid/ Medicare, etc.) -- The pt. MUST have a primary care doctor that directs their care regularly and follows them in the community   [facility]  [phone]   [employer]  [phone]    Agencies that provide inexpensive medical care: Organization         Address  Phone   Notes  [facility]  [phone]   [facility]    [phone]   [facility] [address] [place], [place] [address] [phone]   [facility] [address] [place], [place] [phone]   [facility]    [phone]   [facility]    [phone]   [facility]  [address], [place] Phone:  [phone], Fax:  [phone] Hours of Operation:  9 am - 6 pm, M-F.  Also accepts Medicaid/Medicare and self-pay.  [facility]  [address], [place] Phone: [phone], Fax: [phone]. Hours  of Operation:  8:30 am - 5:30 pm, M-F.  Also accepts Medicaid and self-pay.  [facility] [address], [place] Phone: [phone]   [facility] [address] Natasha Bence [place], [place] [phone], Ext. [phone] Mondays & Thursdays: 7-9 AM.  First 15 patients are seen on a first come, first serve basis.    Medicaid-accepting [facility] Providers:  Organization         Address  Phone   Notes  [facility] [address], [place] [phone] Also accepts self-pay patients.  [facility] [address] Laurell Josephs [place], [place]  [phone]   [facility] [address], [place] [phone]   [facility] [address], [place] [phone]   Renaye Rakers [address], [place]   [phone] Only accepts [place] patients after they have their name applied to their card.   Self-Pay (no insurance) in [facility]:  Organization         Address  Phone   Notes  Sickle Cell Patients, [facility] [address] [place], [place] [phone]   [facility] [address] [place], [place] [phone]   [facility] [place]  [address], [place] [phone]   [facility]/Dr. Osei-Bonsu  [address], [place] or [address], [place] [phone] Phone number for both [place] and [place] locations is the same.  Urgent Medical and [facility] [address] [place] [phone]   [facility] [address], [place] or [address] [phone]   [facility] [address], [place] [phone], phone; [phone], fax Sees patients 1st and 3rd Saturday of every month.  Must not qualify for public or private insurance (i.e. Medicaid,  Medicare,  Health Choice, Veterans' Benefits)  Household income should be no more than 200% of the poverty level The clinic cannot treat you if you are pregnant or think you are pregnant  Sexually transmitted diseases are not treated at the clinic.    Dental Care: Organization         Address  Phone  Notes  [place]  [place] [facility] [address] [place], [place] [phone] Accepts children up to age 56 who are enrolled in [place] or [place] Health Choice; pregnant women with a Medicaid card; and children who have applied for Medicaid or [place] Health Choice, but were declined, whose parents can pay a reduced fee at time of service.  [facility] Department of [facility]  [address], [place] [phone] Accepts children up to age 3 who are enrolled in [place] or [place] Health Choice; pregnant women with a Medicaid card; and children who have applied for Medicaid or [place] Health Choice, but were declined, whose parents can pay a reduced fee at time of service.  [facility]  [address] [place], [place] [phone] Patients are seen by appointment only. Walk-ins are not accepted. [facility] will see patients 62 years of age and older. Monday - Tuesday (8am-5pm) Most Wednesdays (8:30-5pm) $30 per visit, cash only  [facility]  [address], [facility] [phone] Patients are seen by appointment only. Walk-ins are not accepted. [facility] will see patients 14 years of age and older. One Wednesday Evening (Monthly: Volunteer Based).  $30 per visit, cash only  [employer]  [phone] for adults; Children under age 73, call Graduate Pediatric Dentistry at [phone]. Children aged 72-14, please call [phone] to request a pediatric application.  Dental services are provided in all areas of dental care including fillings, crowns  and bridges, complete and partial dentures, implants, gum treatment, root canals, and extractions. Preventive care is also provided. Treatment is provided to both adults and children. Patients are selected via a lottery and there is often a waiting list.   [facility] [address], [place]  [phone] [email or web address]   [facility] [address] [place], [place] [phone], Ext. [phone] Second and Fourth Thursday of each month, opens at 6:30 AM; Clinic ends at 9 AM.  Patients are seen on a first-come first-served basis, and a limited number are seen during each clinic.   [facility]  [address] [place], [place] [phone]   Eligibility Requirements You must have lived in [place], [place], or [place] counties for at least the last three months.   You cannot be eligible for state or federal sponsored [place], including [employer], [place], or [employer].   You generally cannot be eligible for healthcare insurance through your employer.    How to apply: Eligibility screenings are held every Tuesday and Wednesday afternoon from 1:00 pm until 4:00 pm. You do not need an appointment for the interview!  [facility] [address], [place], [place] [phone]   [facility] Health Department  [phone]   [facility] Health Department  [phone]   [facility] Health Department  [phone]    Behavioral Health Resources in the Community: Intensive Outpatient Programs Organization         Address  Phone  Notes  [facility] [address] [place], [place] [phone]   [facility] [address] [place], [place] [phone]   [facility] [address] [place], [place]  [phone]   [facility] [address],  [place], [place] [phone] or [phone]   Substance Abuse  Resources Organization  Address  Phone  Notes  [facility]  [phone]   [facility]  [phone]   [facility]  [phone]   [facility]  [phone]   Residential & Outpatient Substance Abuse Program  [phone]   Psychological Services Organization         Address  Phone  Notes  [facility]  [phone]- [phone]   [facility]  [phone]- [phone]   [facility] [address], [place] [phone] or [phone]    Mobile Crisis Teams Organization         Address  Phone  Notes  [facility], Mobile Crisis Care Unit  [phone]   [facility]  [address] [place], [place] [phone]   Doristine LocksSharon DeEsch [address] [place] [phone]    Self-Help/Support Groups Organization         Address  Phone             Notes  [facility] [place] - variety of support groups  336- [phone] Call for more information  Narcotics Anonymous (NA), [facility]aring [facility] [address], [place]  2 meetings at this location   Statisticianesidential Treatment Programs Organization         Address  Phone  Notes  [facility] [address],    [place]  [phone]   [facility]  [address] [place] [phone], [place], [place] [phone]   [facility] [address], [place] [phone] Admissions: 8am-3pm M-F  [facility] [address]    [place], [place] [phone]   [facility] [address], [place], [place] [phone]   [facility] [address]  [place], [place] [phone]   [facility] - Intensive Outpatient [address], [place], [place] [phone]   [facility] ([facility] [address]  [place], [place] [phone] or [phone]   [facility] ([facility]) [address] [place], [place] [phone] Accepts Medicaid  [facility] [address]  [place] [phone] Substance Abuse/Addiction Treatment   [facility] Behavioral Health Resources Organization         Address  Phone  Notes  [facility]  [phone]   Angie FavaJulie Brannon, PhD [address] [place], [place]   [phone] or [phone]   [facility]   [address] [place], [place] [phone]) [phone]   [facility] [address], [place], [place] [phone] Insurance/Medicaid/sponsorship through [facility]  [facility] [address]                                    [place], [place] [phone] Therapy/tele-psych/case  [facility] [address]   [place], [place] [phone]) [phone]    Dr. Lolly MustacheArfeen  [phone]   [facility]  [employer] [facility] 1) [address], [place] 2) [address], [place] 3)  [address], [place] [phone]  [phone](336) [phone]   [facility] Child Abuse Hotline [phone]) [phone] or [phone]) [phone] (After Hours)
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
## 2013-11-01 MED FILL — Oxycodone w/ Acetaminophen Tab 5-325 MG: ORAL | Qty: 6 | Status: AC

## 2014-02-07 ENCOUNTER — Emergency Department (HOSPITAL_COMMUNITY): Payer: No Typology Code available for payment source

## 2014-02-07 ENCOUNTER — Encounter (HOSPITAL_COMMUNITY): Payer: Self-pay

## 2014-02-07 ENCOUNTER — Emergency Department (HOSPITAL_COMMUNITY)
Admission: EM | Admit: 2014-02-07 | Discharge: 2014-02-07 | Disposition: A | Discharge status: Home / Self Care | Payer: Self-pay | Attending: Emergency Medicine | Admitting: Emergency Medicine

## 2014-02-07 ENCOUNTER — Emergency Department (HOSPITAL_COMMUNITY): Payer: Self-pay

## 2014-02-07 DIAGNOSIS — Z720 Tobacco use: Secondary | ICD-10-CM | POA: Insufficient documentation

## 2014-02-07 DIAGNOSIS — Z7982 Long term (current) use of aspirin: Secondary | ICD-10-CM | POA: Insufficient documentation

## 2014-02-07 DIAGNOSIS — S0992XA Unspecified injury of nose, initial encounter: Secondary | ICD-10-CM | POA: Insufficient documentation

## 2014-02-07 DIAGNOSIS — S299XXA Unspecified injury of thorax, initial encounter: Secondary | ICD-10-CM | POA: Insufficient documentation

## 2014-02-07 DIAGNOSIS — F329 Major depressive disorder, single episode, unspecified: Secondary | ICD-10-CM | POA: Insufficient documentation

## 2014-02-07 DIAGNOSIS — S3992XA Unspecified injury of lower back, initial encounter: Secondary | ICD-10-CM | POA: Insufficient documentation

## 2014-02-07 DIAGNOSIS — Y998 Other external cause status: Secondary | ICD-10-CM | POA: Insufficient documentation

## 2014-02-07 DIAGNOSIS — Z79899 Other long term (current) drug therapy: Secondary | ICD-10-CM | POA: Insufficient documentation

## 2014-02-07 DIAGNOSIS — Y92488 Other paved roadways as the place of occurrence of the external cause: Secondary | ICD-10-CM | POA: Insufficient documentation

## 2014-02-07 DIAGNOSIS — Y9389 Activity, other specified: Secondary | ICD-10-CM | POA: Insufficient documentation

## 2014-02-07 DIAGNOSIS — M549 Dorsalgia, unspecified: Secondary | ICD-10-CM

## 2014-02-07 DIAGNOSIS — S79912A Unspecified injury of left hip, initial encounter: Secondary | ICD-10-CM | POA: Insufficient documentation

## 2014-02-07 DIAGNOSIS — S79911A Unspecified injury of right hip, initial encounter: Secondary | ICD-10-CM | POA: Insufficient documentation

## 2014-02-07 MED ORDER — MORPHINE SULFATE 4 MG/ML IJ SOLN
4.0000 mg | Freq: Once | INTRAMUSCULAR | Status: AC
  Administered 2014-02-07: 4 mg via INTRAVENOUS
  Filled 2014-02-07: qty 1

## 2014-02-07 MED ORDER — ACETAMINOPHEN 325 MG PO TABS
650.0000 mg | ORAL_TABLET | Freq: Once | ORAL | Status: AC
  Administered 2014-02-07: 650 mg via ORAL
  Filled 2014-02-07: qty 2
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
## 2014-02-07 NOTE — ED Notes (Addendum)
Officer at the bedside. Patient returned from Xray and CT.

## 2014-02-07 NOTE — Discharge Instructions (Signed)

## 2014-02-07 NOTE — ED Notes (Signed)
MD aware of patient's pain complete.

## 2014-02-07 NOTE — ED Notes (Signed)
Family at the bedside.

## 2014-02-07 NOTE — ED Notes (Signed)
MD Wofford at the bedside. Walked patient and saw patient's vitals signs. Approved patient to be discharged.

## 2014-02-07 NOTE — ED Notes (Signed)
Pt placed on monitor upon arrival to room. Pt monitored by blood pressure, pulse ox, and 5 lead.  

## 2014-02-07 NOTE — ED Notes (Signed)
Patient placed in blue scrubs. Patient able to get out of bed. Patient took a few steps and stated he was dizzy and in a lot of pain. He was unable to walk at this time. Patient given crackers and Coke. Patient placed back on the cardiac monitor.
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
## 2014-02-07 NOTE — ED Notes (Signed)
MD made aware of patient's pain.  

## 2014-07-11 ENCOUNTER — Emergency Department (HOSPITAL_COMMUNITY)
Admission: EM | Admit: 2014-07-11 | Discharge: 2014-07-11 | Disposition: A | Discharge status: Home / Self Care | Payer: Self-pay | Attending: Emergency Medicine | Admitting: Emergency Medicine

## 2014-07-11 ENCOUNTER — Encounter (HOSPITAL_COMMUNITY): Payer: Self-pay | Admitting: *Deleted

## 2014-07-11 ENCOUNTER — Emergency Department (HOSPITAL_COMMUNITY): Payer: Self-pay

## 2014-07-11 DIAGNOSIS — Z720 Tobacco use: Secondary | ICD-10-CM | POA: Insufficient documentation

## 2014-07-11 DIAGNOSIS — Z8659 Personal history of other mental and behavioral disorders: Secondary | ICD-10-CM | POA: Insufficient documentation

## 2014-07-11 DIAGNOSIS — M542 Cervicalgia: Secondary | ICD-10-CM | POA: Insufficient documentation

## 2014-07-11 DIAGNOSIS — M5440 Lumbago with sciatica, unspecified side: Secondary | ICD-10-CM | POA: Insufficient documentation

## 2014-07-11 DIAGNOSIS — G8929 Other chronic pain: Secondary | ICD-10-CM | POA: Insufficient documentation

## 2014-07-11 DIAGNOSIS — R110 Nausea: Secondary | ICD-10-CM | POA: Insufficient documentation

## 2014-07-11 HISTORY — DX: Dorsalgia, unspecified: M54.9

## 2014-07-11 HISTORY — DX: Other chronic pain: G89.29

## 2014-07-11 MED ORDER — TRAMADOL HCL 50 MG PO TABS: 50.0000 mg | ORAL_TABLET | Freq: Four times a day (QID) | ORAL | Status: DC | PRN

## 2014-07-11 MED ORDER — OXYCODONE-ACETAMINOPHEN 5-325 MG PO TABS
2.0000 | ORAL_TABLET | Freq: Once | ORAL | Status: AC
  Administered 2014-07-11: 2 via ORAL
  Filled 2014-07-11: qty 2

## 2014-07-11 MED ORDER — METHOCARBAMOL 500 MG PO TABS: 500.0000 mg | ORAL_TABLET | Freq: Two times a day (BID) | ORAL | Status: DC

## 2014-07-11 MED ORDER — PREDNISONE 20 MG PO TABS: ORAL_TABLET | ORAL | Status: DC

## 2014-07-11 NOTE — ED Notes (Signed)
Pt alert & oriented x4, stable gait. Patient given discharge instructions, paperwork & prescription(s). Patient  instructed to stop at the registration desk to finish any additional paperwork. Patient verbalized understanding. Pt left department w/ no further questions. 

## 2014-07-11 NOTE — Discharge Instructions (Signed)
Back Pain, Adult °Back pain is very common. The pain often gets better over time. The cause of back pain is usually not dangerous. Most people can learn to manage their back pain on their own.  °HOME CARE  °· Stay active. Start with short walks on flat ground if you can. Try to walk farther each day. °· Do not sit, drive, or stand in one place for more than 30 minutes. Do not stay in bed. °· Do not avoid exercise or work. Activity can help your back heal faster. °· Be careful when you bend or lift an object. Bend at your knees, keep the object close to you, and do not twist. °· Sleep on a firm mattress. Lie on your side, and bend your knees. If you lie on your back, put a pillow under your knees. °· Only take medicines as told by your doctor. °· Put ice on the injured area. °¨ Put ice in a plastic bag. °¨ Place a towel between your skin and the bag. °¨ Leave the ice on for 15-20 minutes, 03-04 times a day for the first 2 to 3 days. After that, you can switch between ice and heat packs. °· Ask your doctor about back exercises or massage. °· Avoid feeling anxious or stressed. Find good ways to deal with stress, such as exercise. °GET HELP RIGHT AWAY IF:  °· Your pain does not go away with rest or medicine. °· Your pain does not go away in 1 week. °· You have new problems. °· You do not feel well. °· The pain spreads into your legs. °· You cannot control when you poop (bowel movement) or pee (urinate). °· Your arms or legs feel weak or lose feeling (numbness). °· You feel sick to your stomach (nauseous) or throw up (vomit). °· You have belly (abdominal) pain. °· You feel like you may pass out (faint). °MAKE SURE YOU:  °· Understand these instructions. °· Will watch your condition. °· Will get help right away if you are not doing well or get worse. °Document Released: 07/24/2007 Document Revised: 04/29/2011 Document Reviewed: 06/08/2013 °[employer]® Patient Information ©2015 [employer]. This information is not intended  to replace advice given to you by your health care provider. Make sure you discuss any questions you have with your health care provider. ° °

## 2014-07-11 NOTE — ED Notes (Addendum)
Pt c/o neck and back pain, weakness in legs, and nausea x 3 weeks. Pt has chronic back pain. Pt states his "back doctor" told him to come back if he began having weakness or numbness in his legs but he is unable to afford him due to lack of insurance. Pt states he just can't stand the pain.
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
## 2015-01-31 ENCOUNTER — Encounter (HOSPITAL_COMMUNITY): Payer: Self-pay | Admitting: Emergency Medicine

## 2015-01-31 ENCOUNTER — Emergency Department (HOSPITAL_COMMUNITY)
Admission: EM | Admit: 2015-01-31 | Discharge: 2015-01-31 | Disposition: A | Discharge status: Home / Self Care | Payer: Self-pay | Attending: Emergency Medicine | Admitting: Emergency Medicine

## 2015-01-31 DIAGNOSIS — Z79899 Other long term (current) drug therapy: Secondary | ICD-10-CM | POA: Insufficient documentation

## 2015-01-31 DIAGNOSIS — M545 Low back pain: Secondary | ICD-10-CM | POA: Insufficient documentation

## 2015-01-31 DIAGNOSIS — G8929 Other chronic pain: Secondary | ICD-10-CM | POA: Insufficient documentation

## 2015-01-31 DIAGNOSIS — Z8659 Personal history of other mental and behavioral disorders: Secondary | ICD-10-CM | POA: Insufficient documentation

## 2015-01-31 DIAGNOSIS — F17210 Nicotine dependence, cigarettes, uncomplicated: Secondary | ICD-10-CM | POA: Insufficient documentation

## 2015-01-31 MED ORDER — METHOCARBAMOL 500 MG PO TABS: 500.0000 mg | ORAL_TABLET | Freq: Two times a day (BID) | ORAL | Status: DC

## 2015-01-31 MED ORDER — METHYLPREDNISOLONE SODIUM SUCC 125 MG IJ SOLR
INTRAMUSCULAR | Status: AC
  Filled 2015-01-31: qty 2

## 2015-01-31 MED ORDER — METHYLPREDNISOLONE SODIUM SUCC 125 MG IJ SOLR
125.0000 mg | Freq: Once | INTRAMUSCULAR | Status: AC
  Administered 2015-01-31: 125 mg via INTRAMUSCULAR

## 2015-01-31 MED ORDER — KETOROLAC TROMETHAMINE 60 MG/2ML IM SOLN
60.0000 mg | Freq: Once | INTRAMUSCULAR | Status: AC
  Administered 2015-01-31: 60 mg via INTRAMUSCULAR
  Filled 2015-01-31: qty 2

## 2015-01-31 MED ORDER — TRAMADOL HCL 50 MG PO TABS: 50.0000 mg | ORAL_TABLET | Freq: Four times a day (QID) | ORAL | Status: DC | PRN

## 2015-01-31 MED ORDER — METHYLPREDNISOLONE SODIUM SUCC 40 MG IJ SOLR: 125.0000 mg | Freq: Once | INTRAMUSCULAR | Status: DC

## 2015-01-31 NOTE — ED Notes (Signed)
Pt c/o of lower back pain that radiates to head and neck since Friday. Pt denies recent/injury or fall. States he has taken Tylenol and Goody's with no relief. Pt ambulatory.

## 2015-01-31 NOTE — ED Provider Notes (Signed)
CSN: [id number]     Arrival date & time 01/31/15  1339 History   First MD Initiated Contact with Patient 01/31/15 1422     Chief Complaint  Patient presents with  . Back Pain     (Consider location/radiation/quality/duration/timing/severity/associated sxs/prior Treatment) Patient is a 44 y.o. male presenting with back pain. The history is provided by the patient. No language interpreter was used.  Back Pain Location:  Generalized Quality:  Aching Radiates to:  Does not radiate Pain severity:  Severe Pain is:  Same all the time Onset quality:  Gradual Duration:  24 months Timing:  Constant Progression:  Worsening Chronicity:  New Context: physical stress and twisting   Relieved by:  Nothing Worsened by:  Nothing tried Ineffective treatments:  None tried Associated symptoms: no fever and no headaches   Risk factors: no recent surgery     Past Medical History  Diagnosis Date  . Depression   . Back pain   . Bipolar 1 disorder (HCC)   . Chronic back pain    Past Surgical History  Procedure Laterality Date  . Foot surgery     No family history on file. Social History  Substance Use Topics  . Smoking status: Current Every Day Smoker -- 0.50 packs/day    Types: Cigarettes  . Smokeless tobacco: None  . Alcohol Use: No    Review of Systems  Constitutional: Negative for fever.  Musculoskeletal: Positive for back pain.  Neurological: Negative for headaches.  All other systems reviewed and are negative.     Allergies  Morphine and related  Home Medications   Prior to Admission medications   Medication Sig Start Date End Date Taking? Authorizing Provider  cyclobenzaprine (FLEXERIL) 10 MG tablet Take 1 tablet (10 mg total) by mouth 3 (three) times daily as needed for muscle spasms. Patient not taking: Reported on 07/11/2014 10/29/13   Dione Boozeavid Glick, MD  methocarbamol (ROBAXIN) 500 MG tablet Take 1 tablet (500 mg total) by mouth 2 (two) times daily. 07/11/14    Gilda Creasehristopher J Pollina, MD  naproxen (NAPROSYN) 500 MG tablet Take 1 tablet (500 mg total) by mouth 2 (two) times daily. Patient not taking: Reported on 07/11/2014 10/29/13   Dione Boozeavid Glick, MD  predniSONE (DELTASONE) 20 MG tablet 3 tabs po daily x 3 days, then 2 tabs x 3 days, then 1.5 tabs x 3 days, then 1 tab x 3 days, then 0.5 tabs x 3 days 07/11/14   Gilda Creasehristopher J Pollina, MD  promethazine (PHENERGAN) 25 MG tablet Take 1 tablet (25 mg total) by mouth every 6 (six) hours as needed for nausea. Patient not taking: Reported on 07/11/2014 02/26/12   Donnetta HutchingBrian Cook, MD  traMADol (ULTRAM) 50 MG tablet Take 1 tablet (50 mg total) by mouth every 6 (six) hours as needed. 07/11/14   Gilda Creasehristopher J Pollina, MD   BP 128/80 mmHg  Pulse 68  Temp(Src) 98 F (36.7 C) (Oral)  Resp 16  Ht 6\' 1"  (1.854 m)  Wt 68.947 kg  BMI 20.06 kg/m2  SpO2 100% Physical Exam  Constitutional: He appears well-developed and well-nourished.  HENT:  Head: Normocephalic and atraumatic.  Right Ear: External ear normal.  Mouth/Throat: Oropharynx is clear and moist.  Cardiovascular: Normal rate and normal heart sounds.   Pulmonary/Chest: Effort normal and breath sounds normal.  Abdominal: Soft.  Musculoskeletal: Normal range of motion.  Tender ls spine,  Tender c spine, pain with movement. nv and ns intact  Neurological: He is alert.  Skin: Skin  is warm.  Psychiatric: He has a normal mood and affect.  Nursing note and vitals reviewed.   ED Course  Procedures (including critical care time) Labs Review Labs Reviewed - No data to display  Imaging Review No results found. I have personally reviewed and evaluated these images and lab results as part of my medical decision-making.   EKG Interpretation None      MDM torodol and solumedrol Im.   Final diagnoses:  Low back pain without sciatica, unspecified back pain laterality    An After Visit Summary was printed and given to the patient. Meds ordered this encounter   Medications  . ketorolac (TORADOL) injection 60 mg    Sig:   . DISCONTD: methylPREDNISolone sodium succinate (SOLU-MEDROL) 40 mg/mL injection 125 mg    Sig:   . methylPREDNISolone sodium succinate (SOLU-MEDROL) 125 mg/2 mL injection    Sig:     Unk Lightning  : cabinet override  . methylPREDNISolone sodium succinate (SOLU-MEDROL) 125 mg/2 mL injection 125 mg    Sig:   . methocarbamol (ROBAXIN) 500 MG tablet    Sig: Take 1 tablet (500 mg total) by mouth 2 (two) times daily.    Dispense:  20 tablet    Refill:  0    Order Specific Question:  Supervising Provider    Answer:  MILLER, BRIAN [[id number]]  . traMADol (ULTRAM) 50 MG tablet    Sig: Take 1 tablet (50 mg total) by mouth every 6 (six) hours as needed.    Dispense:  15 tablet    Refill:  0    Order Specific Question:  Supervising Provider    Answer:  Eber Hong [[id number]]    Lonia Skinner [place], PA-C 01/31/15 1440  Samuel Jester, DO 02/04/15 0002

## 2015-01-31 NOTE — Discharge Instructions (Signed)

## 2015-05-01 ENCOUNTER — Emergency Department (HOSPITAL_COMMUNITY)
Admission: EM | Admit: 2015-05-01 | Discharge: 2015-05-01 | Disposition: A | Discharge status: Home / Self Care | Payer: No Typology Code available for payment source | Attending: Emergency Medicine | Admitting: Emergency Medicine

## 2015-05-01 ENCOUNTER — Encounter (HOSPITAL_COMMUNITY): Payer: Self-pay | Admitting: *Deleted

## 2015-05-01 DIAGNOSIS — F319 Bipolar disorder, unspecified: Secondary | ICD-10-CM | POA: Insufficient documentation

## 2015-05-01 DIAGNOSIS — M542 Cervicalgia: Secondary | ICD-10-CM | POA: Insufficient documentation

## 2015-05-01 DIAGNOSIS — F17210 Nicotine dependence, cigarettes, uncomplicated: Secondary | ICD-10-CM | POA: Insufficient documentation

## 2015-05-01 DIAGNOSIS — M549 Dorsalgia, unspecified: Secondary | ICD-10-CM

## 2015-05-01 DIAGNOSIS — G8929 Other chronic pain: Secondary | ICD-10-CM

## 2015-05-01 DIAGNOSIS — M545 Low back pain: Secondary | ICD-10-CM | POA: Insufficient documentation

## 2015-05-01 DIAGNOSIS — F329 Major depressive disorder, single episode, unspecified: Secondary | ICD-10-CM | POA: Insufficient documentation

## 2015-05-01 MED ORDER — DIAZEPAM 5 MG/ML IJ SOLN
5.0000 mg | Freq: Once | INTRAMUSCULAR | Status: AC
  Administered 2015-05-01: 5 mg via INTRAMUSCULAR
  Filled 2015-05-01: qty 2

## 2015-05-01 MED ORDER — KETOROLAC TROMETHAMINE 60 MG/2ML IM SOLN
60.0000 mg | Freq: Once | INTRAMUSCULAR | Status: AC
  Administered 2015-05-01: 60 mg via INTRAMUSCULAR
  Filled 2015-05-01: qty 2

## 2015-05-01 MED ORDER — NAPROXEN 500 MG PO TABS: ORAL_TABLET | ORAL | Status: DC

## 2015-05-01 MED ORDER — DEXAMETHASONE SODIUM PHOSPHATE 10 MG/ML IJ SOLN
10.0000 mg | Freq: Once | INTRAMUSCULAR | Status: AC
Start: 2015-05-01 — End: 2015-05-01
  Administered 2015-05-01: 10 mg via INTRAMUSCULAR
  Filled 2015-05-01: qty 1

## 2015-05-01 MED ORDER — CYCLOBENZAPRINE HCL 10 MG PO TABS: 10.0000 mg | ORAL_TABLET | Freq: Three times a day (TID) | ORAL | Status: DC | PRN

## 2015-05-01 NOTE — Discharge Instructions (Signed)
Use ice and heat, both will help with your back pain. Take the medications as prescribed. It appears you had seen Dr. Ninfa Linden about your back pain you had in the past. Recheck with him if you aren't improving in the next week. After your back pain is improved start the back pain exercises to strengthen your back. Continue to try to quit smoking, smoking makes chronic pain worse.   Back Injury Prevention Back injuries can be very painful. They can also be difficult to heal. After having one back injury, you are more likely to injure your back again. It is important to learn how to avoid injuring or re-injuring your back. The following tips can help you to prevent a back injury. WHAT SHOULD I KNOW ABOUT PHYSICAL FITNESS? 1. Exercise for 30 minutes per day on most days of the week or as told by your doctor. Make sure to: 1. Do aerobic exercises, such as walking, jogging, biking, or swimming. 2. Do exercises that increase balance and strength, such as tai chi and yoga. 3. Do stretching exercises. This helps with flexibility. 4. Try to develop strong belly (abdominal) muscles. Your belly muscles help to support your back. 2. Stay at a healthy weight. This helps to decrease your risk of a back injury. WHAT SHOULD I KNOW ABOUT MY DIET? 1. Talk with your doctor about your overall diet. Take supplements and vitamins only as told by your doctor. 2. Talk with your doctor about how much calcium and vitamin D you need each day. These nutrients help to prevent weakening of the bones (osteoporosis). 3. Include good sources of calcium in your diet, such as: 1. Dairy products. 2. Green leafy vegetables. 3. Products that have had calcium added to them (fortified). 4. Include good sources of vitamin D in your diet, such as: 1. Milk. 2. Foods that have had vitamin D added to them. WHAT SHOULD I KNOW ABOUT MY POSTURE? 1. Sit up straight and stand up straight. Avoid leaning forward when you sit or hunching over  when you stand. 2. Choose chairs that have good low-back (lumbar) support. 3. If you work at a desk, sit close to it so you do not need to lean over. Keep your chin tucked in. Keep your neck drawn back. Keep your elbows bent so your arms look like the letter "L" (right angle). 4. Sit high and close to the steering wheel when you drive. Add a low-back support to your car seat, if needed. 5. Avoid sitting or standing in one position for very long. Take breaks to get up, stretch, and walk around at least one time every hour. Take breaks every hour if you are driving for long periods of time. 6. Sleep on your side with your knees slightly bent, or sleep on your back with a pillow under your knees. Do not lie on the front of your body to sleep. WHAT SHOULD I KNOW ABOUT LIFTING, TWISTING, AND REACHING Lifting and Heavy Lifting 1. Avoid heavy lifting, especially lifting over and over again. If you must do heavy lifting: 1. Stretch before lifting. 2. Work slowly. 3. Rest between lifts. 4. Use a tool such as a cart or a dolly to move objects if one is available. 5. Make several small trips instead of carrying one heavy load. 6. Ask for help when you need it, especially when moving big objects. 2. Follow these steps when lifting: 1. Stand with your feet shoulder-width apart. 2. Get as close to the object as you  can. Do not pick up a heavy object that is far from your body. 3. Use handles or lifting straps if they are available. 4. Bend at your knees. Squat down, but keep your heels off the floor. 5. Keep your shoulders back. Keep your chin tucked in. Keep your back straight. 6. Lift the object slowly while you tighten the muscles in your legs, belly, and butt. Keep the object as close to the center of your body as possible. 3. Follow these steps when putting down a heavy load: 1. Stand with your feet shoulder-width apart. 2. Lower the object slowly while you tighten the muscles in your legs, belly,  and butt. Keep the object as close to the center of your body as possible. 3. Keep your shoulders back. Keep your chin tucked in. Keep your back straight. 4. Bend at your knees. Squat down, but keep your heels off the floor. 5. Use handles or lifting straps if they are available. Twisting and Reaching 1. Avoid lifting heavy objects above your waist. 2. Do not twist at your waist while you are lifting or carrying a load. If you need to turn, move your feet. 3. Do not bend over without bending at your knees. 4. Avoid reaching over your head, across a table, or for an object on a high surface.  WHAT ARE SOME OTHER TIPS? 1. Avoid wet floors and icy ground. Keep sidewalks clear of ice to prevent falls.  2. Do not sleep on a mattress that is too soft or too hard.  3. Keep items that you use often within easy reach.  4. Put heavier objects on shelves at waist level, and put lighter objects on lower or higher shelves. 5. Find ways to lower your stress, such as: 1. Exercise. 2. Massage. 3. Relaxation techniques. 6. Talk with your doctor if you feel anxious or depressed. These conditions can make back pain worse. 7. Wear flat heel shoes with cushioned soles. 8. Avoid making quick (sudden) movements. 9. Use both shoulder straps when carrying a backpack. 10. Do not use any tobacco products, including cigarettes, chewing tobacco, or electronic cigarettes. If you need help quitting, ask your doctor.   This information is not intended to replace advice given to you by your health care provider. Make sure you discuss any questions you have with your health care provider.   Document Released: 07/24/2007 Document Revised: 06/21/2014 Document Reviewed: 02/08/2014 Elsevier Interactive Patient Education 2016 [employer].  Back Exercises If you have pain in your back, do these exercises 2-3 times each day or as told by your doctor. When the pain goes away, do the exercises once each day, but repeat  the steps more times for each exercise (do more repetitions). If you do not have pain in your back, do these exercises once each day or as told by your doctor. EXERCISES Single Knee to Chest Do these steps 3-5 times in a row for each leg: 3. Lie on your back on a firm bed or the floor with your legs stretched out. 4. Bring one knee to your chest. 5. Hold your knee to your chest by grabbing your knee or thigh. 6. Pull on your knee until you feel a gentle stretch in your lower back. 7. Keep doing the stretch for 10-30 seconds. 8. Slowly let go of your leg and straighten it. Pelvic Tilt Do these steps 5-10 times in a row: 5. Lie on your back on a firm bed or the floor with your legs  stretched out. 6. Bend your knees so they point up to the ceiling. Your feet should be flat on the floor. 7. Tighten your lower belly (abdomen) muscles to press your lower back against the floor. This will make your tailbone point up to the ceiling instead of pointing down to your feet or the floor. 8. Stay in this position for 5-10 seconds while you gently tighten your muscles and breathe evenly. Cat-Cow Do these steps until your lower back bends more easily: 7. Get on your hands and knees on a firm surface. Keep your hands under your shoulders, and keep your knees under your hips. You may put padding under your knees. 8. Let your head hang down, and make your tailbone point down to the floor so your lower back is round like the back of a cat. 9. Stay in this position for 5 seconds. 10. Slowly lift your head and make your tailbone point up to the ceiling so your back hangs low (sags) like the back of a cow. 11. Stay in this position for 5 seconds. Press-Ups Do these steps 5-10 times in a row: 4. Lie on your belly (face-down) on the floor. 5. Place your hands near your head, about shoulder-width apart. 6. While you keep your back relaxed and keep your hips on the floor, slowly straighten your arms to raise the top  half of your body and lift your shoulders. Do not use your back muscles. To make yourself more comfortable, you may change where you place your hands. 7. Stay in this position for 5 seconds. 8. Slowly return to lying flat on the floor. Bridges Do these steps 10 times in a row: 5. Lie on your back on a firm surface. 6. Bend your knees so they point up to the ceiling. Your feet should be flat on the floor. 7. Tighten your butt muscles and lift your butt off of the floor until your waist is almost as high as your knees. If you do not feel the muscles working in your butt and the back of your thighs, slide your feet 1-2 inches farther away from your butt. 8. Stay in this position for 3-5 seconds. 9. Slowly lower your butt to the floor, and let your butt muscles relax. If this exercise is too easy, try doing it with your arms crossed over your chest. Belly Crunches Do these steps 5-10 times in a row: 11. Lie on your back on a firm bed or the floor with your legs stretched out. 12. Bend your knees so they point up to the ceiling. Your feet should be flat on the floor. 49. Cross your arms over your chest. 14. Tip your chin a little bit toward your chest but do not bend your neck. 57. Tighten your belly muscles and slowly raise your chest just enough to lift your shoulder blades a tiny bit off of the floor. 16. Slowly lower your chest and your head to the floor. Back Lifts Do these steps 5-10 times in a row: 1. Lie on your belly (face-down) with your arms at your sides, and rest your forehead on the floor. 2. Tighten the muscles in your legs and your butt. 3. Slowly lift your chest off of the floor while you keep your hips on the floor. Keep the back of your head in line with the curve in your back. Look at the floor while you do this. 4. Stay in this position for 3-5 seconds. 5. Slowly lower your chest and  your face to the floor. GET HELP IF:  Your back pain gets a lot worse when you do an  exercise.  Your back pain does not lessen 2 hours after you exercise. If you have any of these problems, stop doing the exercises. Do not do them again unless your doctor says it is okay. GET HELP RIGHT AWAY IF:  You have sudden, very bad back pain. If this happens, stop doing the exercises. Do not do them again unless your doctor says it is okay.   This information is not intended to replace advice given to you by your health care provider. Make sure you discuss any questions you have with your health care provider.   Document Released: 03/09/2010 Document Revised: 10/26/2014 Document Reviewed: 03/31/2014 Elsevier Interactive Patient Education [employer].

## 2015-05-01 NOTE — ED Provider Notes (Signed)
CSN: [id number]     Arrival date & time 05/01/15  0220 History   First MD Initiated Contact with Patient 05/01/15 0305   Chief Complaint  Patient presents with  . Back Pain     (Consider location/radiation/quality/duration/timing/severity/associated sxs/prior Treatment) HPI patient reports he started having lower back pain 2 days ago that swings around his right side. He states the pain is constant and is stabbing and sharp. He states any type of movement or bending over or sitting makes the pain worse. He states a fetal position makes it feel better. He denies nausea or vomiting. He also states the right side of his neck started hurting 2 days ago and it is also sharp. This problem before with a herniated disc in his back and was treated in 2010 by an orthopedist in [place]. 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  PCP none  Past Medical History  Diagnosis Date  . Depression   . Back pain   . Bipolar 1 disorder (HCC)   . Chronic back pain    Past Surgical History  Procedure Laterality Date  . Foot surgery     No family history on file. Social History  Substance Use Topics  . Smoking status: Current Every Day Smoker -- 0.50 packs/day    Types: Cigarettes  . Smokeless tobacco: None  . Alcohol Use: No  employed Lives with spouse  Review of Systems  All other systems reviewed and are negative.     Allergies  Morphine and related  Home Medications   Prior to Admission medications   Medication Sig Start Date End Date Taking? Authorizing Provider  cyclobenzaprine (FLEXERIL) 10 MG tablet Take 1 tablet (10 mg total) by mouth 3 (three) times daily as needed (muscle soreness). 05/01/15   Devoria Albe, MD  methocarbamol (ROBAXIN) 500 MG tablet Take 1 tablet (500 mg total) by mouth 2 (two) times daily. 01/31/15   Elson Areas, PA-C  naproxen (NAPROSYN) 500 MG tablet Take 1 po BID  with food prn pain 05/01/15   Devoria Albe, MD  predniSONE (DELTASONE) 20 MG tablet 3 tabs po daily x 3 days, then 2 tabs x 3 days, then 1.5 tabs x 3 days, then 1 tab x 3 days, then 0.5 tabs x 3 days 07/11/14   Gilda Crease, MD  promethazine (PHENERGAN) 25 MG tablet Take 1 tablet (25 mg total) by mouth every 6 (six) hours as needed for nausea. Patient not taking: Reported on 07/11/2014 02/26/12   Donnetta Hutching, MD  traMADol (ULTRAM) 50 MG tablet Take 1 tablet (50 mg total) by mouth every 6 (six) hours as needed. 01/31/15   Elson Areas, PA-C   BP 130/86 mmHg  Pulse 82  Temp(Src) 97.5 F (36.4 C) (Oral)  Resp 18  Ht  (1.854 m)  Wt 156 lb (70.761 kg)  BMI 20.59 kg/m2  SpO2 100% Physical Exam  Constitutional: He is oriented to person, place, and time.  Non-toxic appearance. He does not appear ill. No distress.  Thin male  HENT:  Head: Normocephalic and atraumatic.  Right Ear: External ear normal.  Left Ear: External ear normal.  Nose: Nose normal. No mucosal edema or rhinorrhea.  Mouth/Throat: Oropharynx is clear and moist and mucous membranes are normal. No dental abscesses or uvula swelling.  Eyes: Conjunctivae and EOM are normal. Pupils are equal, round, and reactive to light.  Neck: Normal range of motion and full passive range of motion without pain. Neck supple.    Patient's very tender to palpation along the proximal medial aspect of the right trapezius muscle. This reproduces his complaints of neck pain.  Cardiovascular: Normal rate, regular rhythm and normal heart sounds.  Exam reveals no gallop and no friction rub.   No murmur heard. Pulmonary/Chest: Effort normal and breath sounds normal. No respiratory distress. He has no wheezes. He has no rhonchi. He has no rales. He exhibits no tenderness and no crepitus.  Abdominal: Soft. Normal appearance and bowel sounds are normal. He exhibits no distension. There is no tenderness. There is no rebound and no guarding.    Musculoskeletal: Normal range of motion. He exhibits tenderness. He exhibits no edema.       Back:  Patient has bilateral straight leg raising with the right being worse than left however it also hurts in his legs. His patellar reflexes are 2+ and equal bilaterally. He has tenderness along the majority of the lumbar spine midline and also the paraspinous muscles bilaterally. He has pain on range of motion at the waist that reproduces his complaints of pain in his paraspinous muscles.  Neurological: He is alert and oriented to person, place, and time. He has normal strength. No cranial nerve deficit.  Skin: Skin is warm, dry and intact. No rash noted. No erythema. No pallor.  Psychiatric: He has a normal mood and affect. His speech is normal and behavior is normal. His mood appears not anxious.  Nursing note and vitals reviewed.   ED Course  Procedures (including critical care time)  Medications  ketorolac (TORADOL) injection 60 mg (not administered)  dexamethasone (DECADRON) injection 10 mg (not administered)  diazepam (VALIUM) injection 5 mg (not administered)   Patient's wife is here to drive him home.  He was given Toradol IM, Decadron IM, and Valium IM. On review of imaging studies patient had a MRI done in 2014 ordered by Dr. Magnus IvanBlackman, orthopedist. Patient stated he really liked Dr. Magnus IvanBlackman and  will be referred back to him.  March 16, 2012 *RADIOLOGY REPORT*  Clinical Data: Low back pain with right lower extremity extension. Weakness and numbness to the right foot.  MRI LUMBAR SPINE WITHOUT CONTRAST  IMPRESSION:  1. Leftward disc herniation and facet hypertrophy at L5 was one with mild foraminal narrowing bilaterally, worse on the left. 2. 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 There is no other significant stenosis.  Original Report Authenticated By: Marin Robertshristopher Mattern, M.D.   MDM   Final diagnoses:  Chronic back  pain  Neck pain on right side   New Prescriptions   CYCLOBENZAPRINE (FLEXERIL) 10 MG TABLET    Take 1 tablet (10 mg total) by mouth 3 (three) times daily as needed (muscle soreness).   NAPROXEN (NAPROSYN) 500 MG TABLET    Take 1 po BID with food prn pain   Plan discharge  Devoria AlbeIva Iysis Germain, MD, Concha PyoFACEP    Drayton Tieu, MD 05/01/15 [phone]

## 2015-05-01 NOTE — ED Notes (Signed)
Pt c/o right flank pain that radiates to abd area, pt states that he has had pain like this before but it went away, pt also c/o neck pain as well, denies any injury,

## 2015-06-27 ENCOUNTER — Emergency Department (HOSPITAL_COMMUNITY)
Admission: EM | Admit: 2015-06-27 | Discharge: 2015-06-27 | Disposition: A | Discharge status: Home / Self Care | Payer: No Typology Code available for payment source | Attending: Emergency Medicine | Admitting: Emergency Medicine

## 2015-06-27 ENCOUNTER — Encounter (HOSPITAL_COMMUNITY): Payer: Self-pay | Admitting: Emergency Medicine

## 2015-06-27 DIAGNOSIS — M5136 Other intervertebral disc degeneration, lumbar region: Secondary | ICD-10-CM

## 2015-06-27 DIAGNOSIS — Z79899 Other long term (current) drug therapy: Secondary | ICD-10-CM | POA: Insufficient documentation

## 2015-06-27 DIAGNOSIS — F17210 Nicotine dependence, cigarettes, uncomplicated: Secondary | ICD-10-CM | POA: Insufficient documentation

## 2015-06-27 DIAGNOSIS — M62830 Muscle spasm of back: Secondary | ICD-10-CM | POA: Insufficient documentation

## 2015-06-27 DIAGNOSIS — F319 Bipolar disorder, unspecified: Secondary | ICD-10-CM | POA: Insufficient documentation

## 2015-06-27 MED ORDER — DICLOFENAC SODIUM 75 MG PO TBEC: 75.0000 mg | DELAYED_RELEASE_TABLET | Freq: Two times a day (BID) | ORAL | Status: DC

## 2015-06-27 MED ORDER — DEXAMETHASONE SODIUM PHOSPHATE 4 MG/ML IJ SOLN
8.0000 mg | Freq: Once | INTRAMUSCULAR | Status: AC
  Administered 2015-06-27: 8 mg via INTRAMUSCULAR
  Filled 2015-06-27: qty 2

## 2015-06-27 MED ORDER — ONDANSETRON HCL 4 MG PO TABS
4.0000 mg | ORAL_TABLET | Freq: Once | ORAL | Status: AC
  Administered 2015-06-27: 4 mg via ORAL
  Filled 2015-06-27: qty 1

## 2015-06-27 MED ORDER — CYCLOBENZAPRINE HCL 10 MG PO TABS: 10.0000 mg | ORAL_TABLET | Freq: Three times a day (TID) | ORAL | Status: DC

## 2015-06-27 MED ORDER — KETOROLAC TROMETHAMINE 10 MG PO TABS
10.0000 mg | ORAL_TABLET | Freq: Once | ORAL | Status: AC
  Administered 2015-06-27: 10 mg via ORAL
  Filled 2015-06-27: qty 1

## 2015-06-27 MED ORDER — DEXAMETHASONE 4 MG PO TABS: 4.0000 mg | ORAL_TABLET | Freq: Two times a day (BID) | ORAL | Status: DC

## 2015-06-27 MED ORDER — DIAZEPAM 5 MG PO TABS
10.0000 mg | ORAL_TABLET | Freq: Once | ORAL | Status: AC
  Administered 2015-06-27: 10 mg via ORAL
  Filled 2015-06-27: qty 2

## 2015-06-27 NOTE — ED Notes (Signed)
Neck, back, and head pain for last 7 days.  Rates pain 10/10.  Pt says he was turning and heard something pop.

## 2015-06-27 NOTE — Discharge Instructions (Signed)
Degenerative Disk Disease  Degenerative disk disease is a condition caused by the changes that occur in spinal disks as you grow older. Spinal disks are soft and compressible disks located between the bones of your spine (vertebrae). These disks act like shock absorbers. Degenerative disk disease can affect the whole spine. However, the neck and lower back are most commonly affected. Many changes can occur in the spinal disks with aging, such as:  · The spinal disks may dry and shrink.  · Small tears may occur in the tough, outer covering of the disk (annulus).  · The disk space may become smaller due to loss of water.  · Abnormal growths in the bone (spurs) may occur. This can put pressure on the nerve roots exiting the spinal canal, causing pain.  · The spinal canal may become narrowed.  RISK FACTORS   · Being overweight.  · Having a family history of degenerative disk disease.  · Smoking.  · There is increased risk if you are doing heavy lifting or have a sudden injury.  SIGNS AND SYMPTOMS   Symptoms vary from person to person and may include:  · Pain that varies in intensity. Some people have no pain, while others have severe pain. The location of the pain depends on the part of your backbone that is affected.  ¨ You will have neck or arm pain if a disk in the neck area is affected.  ¨ You will have pain in your back, buttocks, or legs if a disk in the lower back is affected.  · Pain that becomes worse while bending, reaching up, or with twisting movements.  · Pain that may start gradually and then get worse as time passes. It may also start after a major or minor injury.  · Numbness or tingling in the arms or legs.  DIAGNOSIS   Your health care provider will ask you about your symptoms and about activities or habits that may cause the pain. He or she may also ask about any injuries, diseases, or treatments you have had. Your health care provider will examine you to check for the range of movement that is  possible in the affected area, to check for strength in your extremities, and to check for sensation in the areas of the arms and legs supplied by different nerve roots. You may also have:   · An X-ray of the spine.  · Other imaging tests, such as MRI.  TREATMENT   Your health care provider will advise you on the best plan for treatment. Treatment may include:  · Medicines.  · Rehabilitation exercises.  HOME CARE INSTRUCTIONS   · Follow proper lifting and walking techniques as advised by your health care provider.  · Maintain good posture.  · Exercise regularly as advised by your health care provider.  · Perform relaxation exercises.  · Change your sitting, standing, and sleeping habits as advised by your health care provider.  · Change positions frequently.  · Lose weight or maintain a healthy weight as advised by your health care provider.  · Do not use any tobacco products, including cigarettes, chewing tobacco, or electronic cigarettes. If you need help quitting, ask your health care provider.  · Wear supportive footwear.  · Take medicines only as directed by your health care provider.  SEEK MEDICAL CARE IF:   · Your pain does not go away within 1-4 weeks.  · You have significant appetite or weight loss.  SEEK IMMEDIATE MEDICAL CARE IF:   ·   Your pain is severe.  · You notice weakness in your arms, hands, or legs.  · You begin to lose control of your bladder or bowel movements.  · You have fevers or night sweats.  MAKE SURE YOU:   · Understand these instructions.  · Will watch your condition.  · Will get help right away if you are not doing well or get worse.     This information is not intended to replace advice given to you by your health care provider. Make sure you discuss any questions you have with your health care provider.     Document Released: 12/02/2006 Document Revised: 02/25/2014 Document Reviewed: 06/08/2013  Elsevier Interactive Patient Education ©2016 [employer]

## 2015-06-27 NOTE — ED Provider Notes (Signed)
CSN: [id number]     Arrival date & time 06/27/15  1653 History   First MD Initiated Contact with Patient 06/27/15 1827     Chief Complaint  Patient presents with  . Back Pain     (Consider location/radiation/quality/duration/timing/severity/associated sxs/prior Treatment) HPI Comments: Chief complaint: Low back pain.  Patient is a 45 year old male who presents to the emergency department with a complaint of neck and back pain. The patient states that this is been going on for some time, but has been worse over the last 7 days. He currently rates his pain as a 10 one to 10 scale. The patient states that this seemed to have been aggravated when he was lifting a television, and turning at the same time. He felt a pop in his back, and has had increasing pain since that time. It is of note that in 2014 he was diagnosed with some disc herniations by MRI. The patient states that for short time after that he received injections in his back, but he has not had any follow-up or evaluation over the last 2 or 3 years. The patient denies any loss of bowel or bladder function. He's not had any recurrent falls. He's not had any numbness or tingling involving the genital areas on. The patient states he has tried Tylenol, but this is not helping him at all. He feels that the problem is getting progressively worse, and requests evaluation and management of this issue.  The history is provided by the patient.    Past Medical History  Diagnosis Date  . Depression   . Back pain   . Bipolar 1 disorder (HCC)   . Chronic back pain    Past Surgical History  Procedure Laterality Date  . Foot surgery     History reviewed. No pertinent family history. Social History  Substance Use Topics  . Smoking status: Current Every Day Smoker -- 0.50 packs/day    Types: Cigarettes  . Smokeless tobacco: None  . Alcohol Use: No    Review of Systems  Gastrointestinal: Positive for nausea.  Musculoskeletal: Positive for  back pain.  Psychiatric/Behavioral:       Depression  All other systems reviewed and are negative.     Allergies  Morphine and related  Home Medications   Prior to Admission medications   Medication Sig Start Date End Date Taking? Authorizing Provider  acetaminophen (TYLENOL) 500 MG tablet Take 500 mg by mouth every 6 (six) hours as needed for mild pain or moderate pain.   Yes Historical Provider, MD  cyclobenzaprine (FLEXERIL) 10 MG tablet Take 1 tablet (10 mg total) by mouth 3 (three) times daily. 06/27/15   Ivery Quale, PA-C  dexamethasone (DECADRON) 4 MG tablet Take 1 tablet (4 mg total) by mouth 2 (two) times daily with a meal. 06/27/15   Ivery Quale, PA-C  diclofenac (VOLTAREN) 75 MG EC tablet Take 1 tablet (75 mg total) by mouth 2 (two) times daily. 06/27/15   Ivery Quale, PA-C   BP 133/83 mmHg  Pulse 99  Temp(Src) 99 F (37.2 C) (Tympanic)  Resp 18  Ht  (1.854 m)  Wt 70.308 kg  BMI 20.45 kg/m2  SpO2 97% Physical Exam  Constitutional: He is oriented to person, place, and time. He appears well-developed and well-nourished.  Non-toxic appearance.  HENT:  Head: Normocephalic.  Right Ear: Tympanic membrane and external ear normal.  Left Ear: Tympanic membrane and external ear normal.  Eyes: EOM and lids are normal. Pupils are  equal, round, and reactive to light.  Neck: Normal range of motion. Neck supple. Carotid bruit is not present.  Cardiovascular: Normal rate, regular rhythm, normal heart sounds, intact distal pulses and normal pulses.   Pulmonary/Chest: Breath sounds normal. No respiratory distress.  Abdominal: Soft. Bowel sounds are normal. There is no tenderness. There is no guarding.  Musculoskeletal: Normal range of motion.  There is increase tightness and tenseness of the upper trapezius area extending into the neck. There is full range of motion of upper extremities.  There is paraspinal area pain on the right and the left at the mid to lower lumbar area.  There is no palpable step off of the lumbar spine, thoracic spine, or cervical spine.  Lymphadenopathy:       Head (right side): No submandibular adenopathy present.       Head (left side): No submandibular adenopathy present.    He has no cervical adenopathy.  Neurological: He is alert and oriented to person, place, and time. He has normal strength. No cranial nerve deficit or sensory deficit.  Gait is steady. There is no foot drop. Patient does walk with a limp at times. Is no gross on neurologic deficit appreciated at this time.  Skin: Skin is warm and dry.  Psychiatric: He has a normal mood and affect. His speech is normal.  Nursing note and vitals reviewed.   ED Course  Procedures (including critical care time) Labs Review Labs Reviewed - No data to display  Imaging Review No results found. I have personally reviewed and evaluated these images and lab results as part of my medical decision-making.   EKG Interpretation None      MDM  Vital signs reviewed. The previous imaging for this patient has been reviewed by me. The examination is negative for acute neurologic deficits at this time. There is no foot drop. There is no evidence for caudal equina or other emergent changes.  The patient will be treated with Flexeril, Decadron, and diclofenac. I've asked the patient to use heating pad to the back, and rest his back is much as possible. The patient is referred to Dr. Linna CapriceSwinteck for orthopedic evaluation of his back issues. Questions were answered. Feel that it is safe for the patient be discharged home.    Final diagnoses:  Lumbar paraspinal muscle spasm  DDD (degenerative disc disease), lumbar    *I have reviewed nursing notes, vital signs, and all appropriate lab and imaging results for this patient.[address]    Chiamaka Latka, PA-C 06/27/15 1907  Raeford RazorStephen Kohut, MD 06/29/15 [phone]

## 2015-08-02 ENCOUNTER — Encounter (HOSPITAL_COMMUNITY): Payer: Self-pay | Admitting: Emergency Medicine

## 2015-08-02 ENCOUNTER — Emergency Department (HOSPITAL_COMMUNITY): Payer: No Typology Code available for payment source

## 2015-08-02 ENCOUNTER — Emergency Department (HOSPITAL_COMMUNITY)
Admission: EM | Admit: 2015-08-02 | Discharge: 2015-08-02 | Disposition: A | Discharge status: Home / Self Care | Payer: No Typology Code available for payment source | Attending: Emergency Medicine | Admitting: Emergency Medicine

## 2015-08-02 DIAGNOSIS — R55 Syncope and collapse: Secondary | ICD-10-CM | POA: Insufficient documentation

## 2015-08-02 DIAGNOSIS — Y999 Unspecified external cause status: Secondary | ICD-10-CM | POA: Insufficient documentation

## 2015-08-02 DIAGNOSIS — Y939 Activity, unspecified: Secondary | ICD-10-CM | POA: Insufficient documentation

## 2015-08-02 DIAGNOSIS — F329 Major depressive disorder, single episode, unspecified: Secondary | ICD-10-CM | POA: Insufficient documentation

## 2015-08-02 DIAGNOSIS — Y929 Unspecified place or not applicable: Secondary | ICD-10-CM | POA: Insufficient documentation

## 2015-08-02 DIAGNOSIS — Z79899 Other long term (current) drug therapy: Secondary | ICD-10-CM | POA: Insufficient documentation

## 2015-08-02 DIAGNOSIS — S0990XA Unspecified injury of head, initial encounter: Secondary | ICD-10-CM | POA: Insufficient documentation

## 2015-08-02 DIAGNOSIS — F17210 Nicotine dependence, cigarettes, uncomplicated: Secondary | ICD-10-CM | POA: Insufficient documentation

## 2015-08-02 DIAGNOSIS — W228XXA Striking against or struck by other objects, initial encounter: Secondary | ICD-10-CM | POA: Insufficient documentation

## 2015-08-02 LAB — URINALYSIS, ROUTINE W REFLEX MICROSCOPIC
Bilirubin Urine: NEGATIVE
GLUCOSE, UA: NEGATIVE mg/dL
Ketones, ur: NEGATIVE mg/dL
Leukocytes, UA: NEGATIVE
NITRITE: NEGATIVE
PH: 5.5 (ref 5.0–8.0)
Protein, ur: NEGATIVE mg/dL
Specific Gravity, Urine: <1.005 — ABNORMAL LOW (ref 1.005–1.030)

## 2015-08-02 LAB — BASIC METABOLIC PANEL
ANION GAP: 8 (ref 5–15)
BUN: 16 mg/dL (ref 6–20)
CO2: 25 mmol/L (ref 22–32)
Calcium: 8.8 mg/dL — ABNORMAL LOW (ref 8.9–10.3)
Chloride: 104 mmol/L (ref 101–111)
Creatinine, Ser: 1.14 mg/dL (ref 0.61–1.24)
Glucose, Bld: 98 mg/dL (ref 65–99)
POTASSIUM: 3.5 mmol/L (ref 3.5–5.1)
SODIUM: 137 mmol/L (ref 135–145)

## 2015-08-02 LAB — CBC WITH DIFFERENTIAL/PLATELET
BASOS ABS: 0 10*3/uL (ref 0.0–0.1)
BASOS PCT: 0 %
EOS ABS: 0.1 10*3/uL (ref 0.0–0.7)
EOS PCT: 1 %
HCT: 41.9 % (ref 39.0–52.0)
Hemoglobin: 14.4 g/dL (ref 13.0–17.0)
LYMPHS PCT: 47 %
Lymphs Abs: 3.6 10*3/uL (ref 0.7–4.0)
MCH: 32.1 pg (ref 26.0–34.0)
MCHC: 34.4 g/dL (ref 30.0–36.0)
MCV: 93.3 fL (ref 78.0–100.0)
Monocytes Absolute: 0.6 10*3/uL (ref 0.1–1.0)
Monocytes Relative: 8 %
Neutro Abs: 3.4 10*3/uL (ref 1.7–7.7)
Neutrophils Relative %: 44 %
PLATELETS: 209 10*3/uL (ref 150–400)
RBC: 4.49 MIL/uL (ref 4.22–5.81)
RDW: 12.9 % (ref 11.5–15.5)
WBC: 7.7 10*3/uL (ref 4.0–10.5)

## 2015-08-02 LAB — URINE MICROSCOPIC-ADD ON

## 2015-08-02 LAB — D-DIMER, QUANTITATIVE (NOT AT ARMC)

## 2015-08-02 LAB — TROPONIN I

## 2015-08-02 MED ORDER — ACETAMINOPHEN 325 MG PO TABS: 650.0000 mg | ORAL_TABLET | Freq: Once | ORAL | Status: DC

## 2015-08-02 MED ORDER — SODIUM CHLORIDE 0.9 % IV BOLUS (SEPSIS)
1000.0000 mL | Freq: Once | INTRAVENOUS | Status: AC
  Administered 2015-08-02: 1000 mL via INTRAVENOUS

## 2015-08-02 MED ORDER — KETOROLAC TROMETHAMINE 30 MG/ML IJ SOLN
30.0000 mg | Freq: Once | INTRAMUSCULAR | Status: AC
Start: 2015-08-02 — End: 2015-08-02
  Administered 2015-08-02: 30 mg via INTRAVENOUS
  Filled 2015-08-02: qty 1

## 2015-08-02 MED ORDER — SODIUM CHLORIDE 0.9 % IV BOLUS (SEPSIS): 1000.0000 mL | Freq: Once | INTRAVENOUS | Status: DC

## 2015-08-02 MED ORDER — KETOROLAC TROMETHAMINE 30 MG/ML IJ SOLN
INTRAMUSCULAR | Status: AC
  Filled 2015-08-02: qty 1

## 2015-08-02 NOTE — ED Notes (Signed)
Patient states that he fell at 022 this am hit head on washer and right side of head hit the wall and back of head hit the floor. Patient noted swelling over right and left eyebrower

## 2015-08-02 NOTE — ED Notes (Signed)
Pt aware of repeat troponin at 430pm. Nad.

## 2015-08-02 NOTE — ED Notes (Addendum)
Pt reports got up last night approx 2am this am and reports bent over to get something out of the refridgerator and reports got dizzy and "passed out".  Pt reports " I was only out for a few seconds." Pt alert and oriented at this time. Speech clear. Pt denies any numbness or one sided weakness. Pt reports head pain,neck pain, back pain ever since. Airway patent. nad noted.

## 2015-08-02 NOTE — Discharge Instructions (Signed)
Syncope Keep yourself hydrated. Follow-up with your doctor. Return to the ED if you develop new or worsening symptoms. Syncope is a medical term for fainting or passing out. This means you lose consciousness and drop to the ground. People are generally unconscious for less than 5 minutes. You may have some muscle twitches for up to 15 seconds before waking up and returning to normal. Syncope occurs more often in older adults, but it can happen to anyone. While most causes of syncope are not dangerous, syncope can be a sign of a serious medical problem. It is important to seek medical care.  CAUSES  Syncope is caused by a sudden drop in blood flow to the brain. The specific cause is often not determined. Factors that can bring on syncope include:  Taking medicines that lower blood pressure.  Sudden changes in posture, such as standing up quickly.  Taking more medicine than prescribed.  Standing in one place for too long.  Seizure disorders.  Dehydration and excessive exposure to heat.  Low blood sugar (hypoglycemia).  Straining to have a bowel movement.  Heart disease, irregular heartbeat, or other circulatory problems.  Fear, emotional distress, seeing blood, or severe pain. SYMPTOMS  Right before fainting, you may:  Feel dizzy or light-headed.  Feel nauseous.  See all white or all black in your field of vision.  Have cold, clammy skin. DIAGNOSIS  Your health care provider will ask about your symptoms, perform a physical exam, and perform an electrocardiogram (ECG) to record the electrical activity of your heart. Your health care provider may also perform other heart or blood tests to determine the cause of your syncope which may include:  Transthoracic echocardiogram (TTE). During echocardiography, sound waves are used to evaluate how blood flows through your heart.  Transesophageal echocardiogram (TEE).  Cardiac monitoring. This allows your health care provider to monitor  your heart rate and rhythm in real time.  Holter monitor. This is a portable device that records your heartbeat and can help diagnose heart arrhythmias. It allows your health care provider to track your heart activity for several days, if needed.  Stress tests by exercise or by giving medicine that makes the heart beat faster. TREATMENT  In most cases, no treatment is needed. Depending on the cause of your syncope, your health care provider may recommend changing or stopping some of your medicines. HOME CARE INSTRUCTIONS  Have someone stay with you until you feel stable.  Do not drive, use machinery, or play sports until your health care provider says it is okay.  Keep all follow-up appointments as directed by your health care provider.  Lie down right away if you start feeling like you might faint. Breathe deeply and steadily. Wait until all the symptoms have passed.  Drink enough fluids to keep your urine clear or pale yellow.  If you are taking blood pressure or heart medicine, get up slowly and take several minutes to sit and then stand. This can reduce dizziness. SEEK IMMEDIATE MEDICAL CARE IF:   You have a severe headache.  You have unusual pain in the chest, abdomen, or back.  You are bleeding from your mouth or rectum, or you have black or tarry stool.  You have an irregular or very fast heartbeat.  You have pain with breathing.  You have repeated fainting or seizure-like jerking during an episode.  You faint when sitting or lying down.  You have confusion.  You have trouble walking.  You have severe weakness.  You have vision problems. If you fainted, call your local emergency services (911 in [place] Do not drive yourself to the hospital.    This information is not intended to replace advice given to you by your health care provider. Make sure you discuss any questions you have with your health care provider.   Document Released: 02/04/2005 Document Revised:  06/21/2014 Document Reviewed: 04/05/2011 Elsevier Interactive Patient Education [employer].

## 2015-08-02 NOTE — ED Provider Notes (Signed)
History  By signing my name below, I, Earmon PhoenixJennifer Waddell, attest that this documentation has been prepared under the direction and in the presence of Glynn OctaveStephen Pavle Wiler, MD. Electronically Signed: Earmon PhoenixJennifer Waddell, ED Scribe. 08/02/2015. 1:08 PM.  Chief Complaint  Patient presents with  . Loss of Consciousness   The history is provided by the patient and medical records. No language interpreter was used.    HPI Comments:  [name] is a 45 y.o. male with PMHx of chronic back pain who presents to the Emergency Department complaining of LOC that occurred approximately 11 hours ago and lasted for only a few seconds. Pt states he leaned over to get a drink from his refrigerator after waking in the middle of the night and became light-headed and diaphoretic after standing back up and started to walk back to bed. He reports hitting his head when he fell. He reports HA, left hip pain, neck pain and nausea now. He reports some mild chest soreness that has been constant since waking this morning and is still intermittently light-headed. He states he had some blurred vision upon waking this morning that has since resolved. He states he has been eating and drinking normally. He has taken Tylenol with no significant relief of the pain. He denies modifying factors of all symptoms but states pushing on his chest increases the CP. He denies vomiting, bruising, wounds, back pain. He denies alcohol use. He denies any cardiac history or DM. He does not have a PCP. He has been ambulatory since the fall.   Past Medical History  Diagnosis Date  . Depression   . Back pain   . Bipolar 1 disorder (HCC)   . Chronic back pain    Past Surgical History  Procedure Laterality Date  . Foot surgery     History reviewed. No pertinent family history. Social History  Substance Use Topics  . Smoking status: Current Every Day Smoker -- 0.50 packs/day    Types: Cigarettes  . Smokeless tobacco: None  . Alcohol Use: No     Review of Systems A complete 10 system review of systems was obtained and all systems are negative except as noted in the HPI and PMH.   Allergies  Review of patient's allergies indicates no active allergies.  Home Medications   Prior to Admission medications   Medication Sig Start Date End Date Taking? Authorizing Provider  acetaminophen (TYLENOL) 500 MG tablet Take 500 mg by mouth every 6 (six) hours as needed for mild pain or moderate pain.    Historical Provider, MD  cyclobenzaprine (FLEXERIL) 10 MG tablet Take 1 tablet (10 mg total) by mouth 3 (three) times daily. 06/27/15   Ivery QualeHobson Bryant, PA-C  dexamethasone (DECADRON) 4 MG tablet Take 1 tablet (4 mg total) by mouth 2 (two) times daily with a meal. 06/27/15   Ivery QualeHobson Bryant, PA-C  diclofenac (VOLTAREN) 75 MG EC tablet Take 1 tablet (75 mg total) by mouth 2 (two) times daily. 06/27/15   Ivery QualeHobson Bryant, PA-C   Triage Vitals: BP 92/63 mmHg  Pulse 95  Temp(Src) 97.9 F (36.6 C) (Oral)  Resp 16  Ht 6\' 1"  (1.854 m)  Wt 160 lb (72.576 kg)  BMI 21.11 kg/m2  SpO2 99% Physical Exam  Constitutional: He is oriented to person, place, and time. He appears well-developed and well-nourished. No distress.  Disheveled.  HENT:  Head: Normocephalic and atraumatic.  Mouth/Throat: Oropharynx is clear and moist. No oropharyngeal exudate.  Eyes: Conjunctivae and EOM are normal. Pupils  are equal, round, and reactive to light.  Neck: Normal range of motion. Neck supple.  No meningismus.  Cardiovascular: Normal rate, regular rhythm, normal heart sounds and intact distal pulses.   No murmur heard.  Intact DP and PT pulses bilaterally.  Pulmonary/Chest: Effort normal and breath sounds normal. No respiratory distress.  Abdominal: Soft. There is no tenderness. There is no rebound and no guarding.  Musculoskeletal: Normal range of motion. He exhibits no edema or tenderness.  Diffuse paraspinal C-spine pain. Reproducible left sided chest wall pain. Left  lateral hip tenderness and pain with ROM.  Neurological: He is alert and oriented to person, place, and time. No cranial nerve deficit. He exhibits normal muscle tone. Coordination normal.  No ataxia on finger to nose bilaterally. No pronator drift. 5/5 strength throughout. CN 2-12 intact.Equal grip strength. Sensation intact.  Skin: Skin is warm.  Psychiatric: He has a normal mood and affect. His behavior is normal.  Nursing note and vitals reviewed.   ED Course  Procedures (including critical care time) DIAGNOSTIC STUDIES: Oxygen Saturation is 99% on RA, normal by my interpretation.   COORDINATION OF CARE: 12:58 PM- Will order labs, head and neck CT, left hip X-Ray and EKG. Pt verbalizes understanding and agrees to plan.  Medications - No data to display  Labs Review Labs Reviewed  BASIC METABOLIC PANEL - Abnormal; Notable for the following:    Calcium 8.8 (*)    All other components within normal limits  URINALYSIS, ROUTINE W REFLEX MICROSCOPIC (NOT AT [facility]) - Abnormal; Notable for the following:    Specific Gravity, Urine <1.005 (*)    Hgb urine dipstick TRACE (*)    All other components within normal limits  URINE MICROSCOPIC-ADD ON - Abnormal; Notable for the following:    Squamous Epithelial / LPF 0-5 (*)    Bacteria, UA RARE (*)    All other components within normal limits  CBC WITH DIFFERENTIAL/PLATELET  TROPONIN I  TROPONIN I  D-DIMER, QUANTITATIVE (NOT AT [facility])    Imaging Review Dg Chest 2 View  08/02/2015  CLINICAL DATA:  Recent syncope and dizziness EXAM: CHEST  2 VIEW COMPARISON:  February 07, 2014 FINDINGS: There is no edema or consolidation. The heart size and pulmonary vascularity are normal. No adenopathy. No bone lesions. No pneumothorax. IMPRESSION: No edema or consolidation. Electronically Signed   By: Bretta Bang III M.D.   On: 08/02/2015 13:44   Ct Head Wo Contrast  08/02/2015  CLINICAL DATA:  Head and neck pain after fall last night. Initial  encounter. EXAM: CT HEAD WITHOUT CONTRAST CT CERVICAL SPINE WITHOUT CONTRAST TECHNIQUE: Multidetector CT imaging of the head and cervical spine was performed following the standard protocol without intravenous contrast. Multiplanar CT image reconstructions of the cervical spine were also generated. COMPARISON:  Cervical spine CT 07/11/2014.  Head CT 02/07/2014 FINDINGS: CT HEAD FINDINGS Skull and Sinuses:Negative for fracture or destructive process. The visualized mastoids, middle ears, and imaged paranasal sinuses are clear. Visualized orbits: Negative. Brain: Unremarkable. No evidence of acute infarction, hemorrhage, hydrocephalus, or mass lesion/mass effect. CT CERVICAL SPINE FINDINGS Negative for acute fracture or subluxation. No prevertebral edema. No gross cervical canal hematoma. C3-4 and C4-5 disc degeneration with endplate and uncovertebral ridging. Paraseptal emphysema at the apices. IMPRESSION: No evidence of intracranial or cervical spine injury. Electronically Signed   By: Marnee Spring M.D.   On: 08/02/2015 14:00   Ct Cervical Spine Wo Contrast  08/02/2015  CLINICAL DATA:  Head and neck pain  after fall last night. Initial encounter. EXAM: CT HEAD WITHOUT CONTRAST CT CERVICAL SPINE WITHOUT CONTRAST TECHNIQUE: Multidetector CT imaging of the head and cervical spine was performed following the standard protocol without intravenous contrast. Multiplanar CT image reconstructions of the cervical spine were also generated. COMPARISON:  Cervical spine CT 07/11/2014.  Head CT 02/07/2014 FINDINGS: CT HEAD FINDINGS Skull and Sinuses:Negative for fracture or destructive process. The visualized mastoids, middle ears, and imaged paranasal sinuses are clear. Visualized orbits: Negative. Brain: Unremarkable. No evidence of acute infarction, hemorrhage, hydrocephalus, or mass lesion/mass effect. CT CERVICAL SPINE FINDINGS Negative for acute fracture or subluxation. No prevertebral edema. No gross cervical canal  hematoma. C3-4 and C4-5 disc degeneration with endplate and uncovertebral ridging. Paraseptal emphysema at the apices. IMPRESSION: No evidence of intracranial or cervical spine injury. Electronically Signed   By: Marnee Spring M.D.   On: 08/02/2015 14:00   Dg Hip Unilat With Pelvis 2-3 Views Left  08/02/2015  CLINICAL DATA:  Hip pain after fall last night EXAM: DG HIP (WITH OR WITHOUT PELVIS) 2-3V LEFT COMPARISON:  None. FINDINGS: Three views of the left hip submitted. No acute fracture or subluxation. No radiopaque foreign body. IMPRESSION: Negative. Electronically Signed   By: Natasha Mead M.D.   On: 08/02/2015 13:44   I have personally reviewed and evaluated these images and lab results as part of my medical decision-making.   EKG Interpretation   Date/Time:  Wednesday August 02 2015 13:13:13 EDT Ventricular Rate:  76 PR Interval:  105 QRS Duration: 100 QT Interval:  380 QTC Calculation: 427 R Axis:   92 Text Interpretation:  Sinus rhythm Short PR interval Borderline right axis  deviation No significant change was found Confirmed by Manus Gunning  MD,  Sadira Standard [phone]) on 08/02/2015 1:21:13 PM      MDM   Final diagnoses:  Syncope, unspecified syncope type  Head injury, initial encounter   Patient reports syncopal episode last night that onset after he was standing up from a bent position while getting something out of the refrigerator. He   got lightheaded and hit his head. Denies any preceding chest pain or shortness of breath. Denies any headache prior to syncope. He complains of head neck and left hip pain since. Also has reproducible chest pain that started after the accident.   EKG nsr. No Brugada, no prolonged QT. Orthostatics positive. HR 72 to 92 with standing.  CT head and C-spine are negative. Patient's chest pain is reproducible. EKG without acute ST changes. Atypical for ACS.  Troponin negative 2. Patient tolerating by mouth and ambulatory. Suspect syncope due to  orthostasis. D-dimer negative.  Patient feels improved.  [place] syncope rules negative. Maintain hydration at home.  He is anxious to leave. Follow up with PCP. Return precautions discussed.    I personally performed the services described in this documentation, which was scribed in my presence. The recorded information has been reviewed and is accurate.   Glynn Octave, MD 08/02/15 [phone]

## 2015-08-02 NOTE — ED Notes (Signed)
Pt sleeping. 

## 2015-12-05 ENCOUNTER — Emergency Department (HOSPITAL_COMMUNITY): Payer: Self-pay

## 2015-12-05 ENCOUNTER — Encounter (HOSPITAL_COMMUNITY): Payer: Self-pay | Admitting: Emergency Medicine

## 2015-12-05 ENCOUNTER — Emergency Department (HOSPITAL_COMMUNITY)
Admission: EM | Admit: 2015-12-05 | Discharge: 2015-12-05 | Disposition: A | Discharge status: Home / Self Care | Payer: Self-pay | Attending: Emergency Medicine | Admitting: Emergency Medicine

## 2015-12-05 DIAGNOSIS — R109 Unspecified abdominal pain: Secondary | ICD-10-CM

## 2015-12-05 DIAGNOSIS — R319 Hematuria, unspecified: Secondary | ICD-10-CM | POA: Insufficient documentation

## 2015-12-05 DIAGNOSIS — F17210 Nicotine dependence, cigarettes, uncomplicated: Secondary | ICD-10-CM | POA: Insufficient documentation

## 2015-12-05 DIAGNOSIS — Z87442 Personal history of urinary calculi: Secondary | ICD-10-CM | POA: Insufficient documentation

## 2015-12-05 LAB — COMPREHENSIVE METABOLIC PANEL
ALK PHOS: 55 U/L (ref 38–126)
ALT: 10 U/L — ABNORMAL LOW (ref 17–63)
ANION GAP: 5 (ref 5–15)
AST: 15 U/L (ref 15–41)
Albumin: 3.9 g/dL (ref 3.5–5.0)
BILIRUBIN TOTAL: 0.5 mg/dL (ref 0.3–1.2)
BUN: 16 mg/dL (ref 6–20)
CALCIUM: 8.8 mg/dL — AB (ref 8.9–10.3)
CO2: 27 mmol/L (ref 22–32)
Chloride: 108 mmol/L (ref 101–111)
Creatinine, Ser: 0.88 mg/dL (ref 0.61–1.24)
GFR calc non Af Amer: >60 mL/min (ref >60)
Glucose, Bld: 128 mg/dL — ABNORMAL HIGH (ref 65–99)
Potassium: 3.7 mmol/L (ref 3.5–5.1)
Sodium: 140 mmol/L (ref 135–145)
TOTAL PROTEIN: 6.4 g/dL — AB (ref 6.5–8.1)

## 2015-12-05 LAB — CBC WITH DIFFERENTIAL/PLATELET
BASOS ABS: 0 10*3/uL (ref 0.0–0.1)
BASOS PCT: 0 %
EOS ABS: 0.1 10*3/uL (ref 0.0–0.7)
Eosinophils Relative: 2 %
HEMATOCRIT: 43 % (ref 39.0–52.0)
HEMOGLOBIN: 14.1 g/dL (ref 13.0–17.0)
Lymphocytes Relative: 36 %
Lymphs Abs: 2.5 10*3/uL (ref 0.7–4.0)
MCH: 31.3 pg (ref 26.0–34.0)
MCHC: 32.8 g/dL (ref 30.0–36.0)
MCV: 95.3 fL (ref 78.0–100.0)
MONO ABS: 0.5 10*3/uL (ref 0.1–1.0)
Monocytes Relative: 7 %
NEUTROS ABS: 3.8 10*3/uL (ref 1.7–7.7)
NEUTROS PCT: 55 %
Platelets: 184 10*3/uL (ref 150–400)
RBC: 4.51 MIL/uL (ref 4.22–5.81)
RDW: 13 % (ref 11.5–15.5)
WBC: 7 10*3/uL (ref 4.0–10.5)

## 2015-12-05 LAB — URINE MICROSCOPIC-ADD ON
Bacteria, UA: NONE SEEN
SQUAMOUS EPITHELIAL / LPF: NONE SEEN
WBC UA: NONE SEEN WBC/hpf (ref 0–5)

## 2015-12-05 LAB — URINALYSIS, ROUTINE W REFLEX MICROSCOPIC
Bilirubin Urine: NEGATIVE
Glucose, UA: NEGATIVE mg/dL
KETONES UR: NEGATIVE mg/dL
LEUKOCYTES UA: NEGATIVE
NITRITE: NEGATIVE
PH: 5.5 (ref 5.0–8.0)
Protein, ur: NEGATIVE mg/dL
Specific Gravity, Urine: 1.025 (ref 1.005–1.030)

## 2015-12-05 MED ORDER — KETOROLAC TROMETHAMINE 30 MG/ML IJ SOLN
30.0000 mg | Freq: Once | INTRAMUSCULAR | Status: AC
Start: 2015-12-05 — End: 2015-12-05
  Administered 2015-12-05: 30 mg via INTRAVENOUS
  Filled 2015-12-05: qty 1

## 2015-12-05 MED ORDER — HYDROCODONE-ACETAMINOPHEN 5-325 MG PO TABS: 1.0000 | ORAL_TABLET | ORAL | 0 refills | Status: DC | PRN

## 2015-12-05 MED ORDER — ONDANSETRON HCL 4 MG/2ML IJ SOLN
4.0000 mg | Freq: Once | INTRAMUSCULAR | Status: AC
Start: 2015-12-05 — End: 2015-12-05
  Administered 2015-12-05: 4 mg via INTRAVENOUS
  Filled 2015-12-05: qty 2

## 2015-12-05 MED ORDER — HYDROMORPHONE HCL 1 MG/ML IJ SOLN
1.0000 mg | Freq: Once | INTRAMUSCULAR | Status: AC
  Administered 2015-12-05: 1 mg via INTRAVENOUS
  Filled 2015-12-05: qty 1

## 2015-12-05 NOTE — ED Triage Notes (Signed)
pt c/o rigth flank pain radiating into abdomen x 2 days with dysuria.

## 2015-12-05 NOTE — ED Provider Notes (Signed)
[facility] DEPT Provider Note   CSN: [id number] Arrival date & time: 12/05/15  1009     History   Chief Complaint Chief Complaint  Patient presents with  . Flank Pain    HPI [name] is a 45 y.o. male with a past medical history outlined below and also including prior history of kidney stones, last occurring in 2014 presenting with a 2 day history of right sided flank pain which radiates into his right lower abdomen and has been associated with dysuria.  He describes increased urinary frequency of small amounts of urine in association with increased painful urination.  He denies hematuria.  He has had no fevers or chills, he does endorse nausea without emesis and has had a decreased appetite for the past 2 days.  He has had no medications prior to arrival.  He also endorses chronic low back pain with radiculopathy, his symptoms today are most consistent with prior kidney stone passage.  He is currently awaiting obtaining PCP with [facility] medicine.  The history is provided by the patient and the spouse.    Past Medical History:  Diagnosis Date  . Back pain   . Bipolar 1 disorder (HCC)   . Chronic back pain   . Depression     There are no active problems to display for this patient.   Past Surgical History:  Procedure Laterality Date  . FOOT SURGERY         Home Medications    Prior to Admission medications   Medication Sig Start Date End Date Taking? Authorizing Provider  acetaminophen (TYLENOL) 500 MG tablet Take 1,000 mg by mouth every 6 (six) hours as needed for mild pain or moderate pain.     Historical Provider, MD  cyclobenzaprine (FLEXERIL) 10 MG tablet Take 1 tablet (10 mg total) by mouth 3 (three) times daily. Patient taking differently: Take 10 mg by mouth 3 (three) times daily as needed for muscle spasms.  06/27/15   Ivery Quale, PA-C  dexamethasone (DECADRON) 4 MG tablet Take 1 tablet (4 mg total) by mouth 2 (two) times daily  with a meal. Patient not taking: Reported on 08/02/2015 06/27/15   Ivery Quale, PA-C  diclofenac (VOLTAREN) 75 MG EC tablet Take 1 tablet (75 mg total) by mouth 2 (two) times daily. Patient not taking: Reported on 08/02/2015 06/27/15   Ivery Quale, PA-C    Family History No family history on file.  Social History Social History  Substance Use Topics  . Smoking status: Current Every Day Smoker    Packs/day: 0.50    Types: Cigarettes  . Smokeless tobacco: Never Used  . Alcohol use No     Allergies   Review of patient's allergies indicates no known allergies.   Review of Systems Review of Systems  Constitutional: Negative for fever.  HENT: Negative for congestion and sore throat.   Eyes: Negative.   Respiratory: Negative for chest tightness and shortness of breath.   Cardiovascular: Negative for chest pain.  Gastrointestinal: Positive for abdominal pain and nausea.  Genitourinary: Positive for dysuria, flank pain and frequency. Negative for discharge and hematuria.  Musculoskeletal: Negative for arthralgias, joint swelling and neck pain.  Skin: Negative.  Negative for rash and wound.  Neurological: Negative for dizziness, weakness, light-headedness, numbness and headaches.  Psychiatric/Behavioral: Negative.      Physical Exam Updated Vital Signs BP 135/85   Pulse 68   Temp 97.7 F (36.5 C)   Resp 18   Ht  6\' 1"  (1.854 m)   Wt 69.9 kg   SpO2 100%   BMI 20.32 kg/m   Physical Exam  Constitutional: He appears well-developed and well-nourished.  HENT:  Head: Normocephalic and atraumatic.  Eyes: Conjunctivae are normal.  Neck: Normal range of motion.  Cardiovascular: Normal rate, regular rhythm, normal heart sounds and intact distal pulses.   Pulmonary/Chest: Effort normal and breath sounds normal. He has no wheezes.  Abdominal: Soft. Bowel sounds are normal. There is no tenderness. There is CVA tenderness.  Right CVA tenderness to palpation.  Musculoskeletal:  Normal range of motion.  Neurological: He is alert.  Skin: Skin is warm and dry.  Psychiatric: He has a normal mood and affect.  Nursing note and vitals reviewed.    ED Treatments / Results  Labs (all labs ordered are listed, but only abnormal results are displayed) Labs Reviewed  COMPREHENSIVE METABOLIC PANEL - Abnormal; Notable for the following:       Result Value   Glucose, Bld 128 (*)    Calcium 8.8 (*)    Total Protein 6.4 (*)    ALT 10 (*)    All other components within normal limits  URINALYSIS, ROUTINE W REFLEX MICROSCOPIC (NOT AT [facility]) - Abnormal; Notable for the following:    Hgb urine dipstick MODERATE (*)    All other components within normal limits  CBC WITH DIFFERENTIAL/PLATELET  URINE MICROSCOPIC-ADD ON    EKG  EKG Interpretation None       Radiology Ct Renal Stone Study  Result Date: 12/05/2015 CLINICAL DATA:  Right flank pain for 2 days. History of kidney stones. Hematuria. EXAM: CT ABDOMEN AND PELVIS WITHOUT CONTRAST TECHNIQUE: Multidetector CT imaging of the abdomen and pelvis was performed following the standard protocol without IV contrast. COMPARISON:  02/26/2012 FINDINGS: Lower chest: Visualized lung bases are clear. Hepatobiliary: No focal liver abnormality is seen. No gallstones, gallbladder wall thickening, or biliary dilatation. Pancreas: Unremarkable. Spleen: Unremarkable. Adrenals/Urinary Tract: Unremarkable adrenal glands. 7 mm low-density lesion in the interpolar right kidney, likely a cyst. No right renal calculi are seen. There is a punctate 1 mm nonobstructing calculus in the upper pole of the left kidney which is new. No hydronephrosis or ureteral calculi are identified. The bladder is largely decompressed and unremarkable. Stomach/Bowel: The stomach is within normal limits. There is no evidence of bowel obstruction or wall thickening. The appendix is unremarkable. Vascular/Lymphatic: Minimal scattered abdominal aortic atherosclerotic  calcification without aneurysm. No enlarged lymph nodes are identified in the abdomen or pelvis. Reproductive: Unremarkable prostate. Other: No intraperitoneal free fluid. No abdominal wall mass or hernia. Musculoskeletal: Unchanged ankylosis across the anterior SI joints bilaterally. Moderate disc space narrowing at L5-S1 with associated vacuum disc phenomenon and degenerative endplate sclerosis, progressed from prior. IMPRESSION: 1. No right renal calculi or hydronephrosis. 2. Nonobstructing left renal calculus. 3. Minimal aortic atherosclerosis. Electronically Signed   By: Sebastian AcheAllen  Grady M.D.   On: 12/05/2015 11:10    Procedures Procedures (including critical care time)  Medications Ordered in ED Medications  ondansetron (ZOFRAN) injection 4 mg (not administered)  HYDROmorphone (DILAUDID) injection 1 mg (not administered)     Initial Impression / Assessment and Plan / ED Course  I have reviewed the triage vital signs and the nursing notes.  Pertinent labs & imaging results that were available during my care of the patient were reviewed by me and considered in my medical decision making (see chart for details).  Clinical Course    Suspect probable passed kidney  stone given hematuria.  He was prescribed hydrocodone for pain relief.  Urology referral given. Recheck for worsened pain, fevers, vomiting.    Final Clinical Impressions(s) / ED Diagnoses   Final diagnoses:  Flank pain    New Prescriptions Discharge Medication List as of 12/05/2015 11:37 AM    START taking these medications   Details  HYDROcodone-acetaminophen (NORCO/VICODIN) 5-325 MG tablet Take 1 tablet by mouth every 4 (four) hours as needed., Starting Tue 12/05/2015, Print         Burgess Amor, PA-C 12/05/15 1704    Azalia Bilis, MD 12/08/15 1017

## 2015-12-05 NOTE — Discharge Instructions (Signed)
It's possible you have already passed a kidney stone on the right, there is no stone seen at this time. You may take the hydrocodone prescribed for pain relief.  This will make you drowsy - do not drive within 4 hours of taking this medication.

## 2016-07-28 ENCOUNTER — Emergency Department (HOSPITAL_COMMUNITY)
Admission: EM | Admit: 2016-07-28 | Discharge: 2016-07-28 | Disposition: A | Discharge status: Home / Self Care | Payer: Self-pay | Attending: Emergency Medicine | Admitting: Emergency Medicine

## 2016-07-28 ENCOUNTER — Emergency Department (HOSPITAL_COMMUNITY)
Admission: EM | Admit: 2016-07-28 | Discharge: 2016-07-28 | Disposition: A | Discharge status: Home / Self Care | Payer: Self-pay | Source: Home / Self Care

## 2016-07-28 ENCOUNTER — Encounter (HOSPITAL_COMMUNITY): Payer: Self-pay | Admitting: *Deleted

## 2016-07-28 ENCOUNTER — Emergency Department (HOSPITAL_COMMUNITY): Payer: Self-pay

## 2016-07-28 DIAGNOSIS — M5136 Other intervertebral disc degeneration, lumbar region: Secondary | ICD-10-CM

## 2016-07-28 DIAGNOSIS — F17210 Nicotine dependence, cigarettes, uncomplicated: Secondary | ICD-10-CM

## 2016-07-28 DIAGNOSIS — M5440 Lumbago with sciatica, unspecified side: Secondary | ICD-10-CM | POA: Insufficient documentation

## 2016-07-28 DIAGNOSIS — R109 Unspecified abdominal pain: Secondary | ICD-10-CM

## 2016-07-28 DIAGNOSIS — R300 Dysuria: Secondary | ICD-10-CM | POA: Insufficient documentation

## 2016-07-28 DIAGNOSIS — M5441 Lumbago with sciatica, right side: Secondary | ICD-10-CM | POA: Insufficient documentation

## 2016-07-28 LAB — URINALYSIS, ROUTINE W REFLEX MICROSCOPIC
Bacteria, UA: NONE SEEN
Glucose, UA: NEGATIVE mg/dL
Hgb urine dipstick: NEGATIVE
Ketones, ur: NEGATIVE mg/dL
LEUKOCYTES UA: NEGATIVE
NITRITE: NEGATIVE
PH: 5 (ref 5.0–8.0)
Protein, ur: 30 mg/dL — AB
Specific Gravity, Urine: 1.033 — ABNORMAL HIGH (ref 1.005–1.030)
Squamous Epithelial / LPF: NONE SEEN

## 2016-07-28 LAB — BASIC METABOLIC PANEL
Anion gap: 8 (ref 5–15)
BUN: 18 mg/dL (ref 6–20)
CO2: 27 mmol/L (ref 22–32)
CREATININE: 0.95 mg/dL (ref 0.61–1.24)
Calcium: 9 mg/dL (ref 8.9–10.3)
Chloride: 109 mmol/L (ref 101–111)
GFR calc non Af Amer: >60 mL/min (ref >60)
Glucose, Bld: 101 mg/dL — ABNORMAL HIGH (ref 65–99)
Potassium: 3.8 mmol/L (ref 3.5–5.1)
Sodium: 144 mmol/L (ref 135–145)

## 2016-07-28 LAB — CBC
HCT: 41.8 % (ref 39.0–52.0)
HEMOGLOBIN: 13.8 g/dL (ref 13.0–17.0)
MCH: 31.2 pg (ref 26.0–34.0)
MCHC: 33 g/dL (ref 30.0–36.0)
MCV: 94.6 fL (ref 78.0–100.0)
PLATELETS: 193 10*3/uL (ref 150–400)
RBC: 4.42 MIL/uL (ref 4.22–5.81)
RDW: 13.2 % (ref 11.5–15.5)
WBC: 6.7 10*3/uL (ref 4.0–10.5)

## 2016-07-28 MED ORDER — CYCLOBENZAPRINE HCL 10 MG PO TABS: 10.0000 mg | ORAL_TABLET | Freq: Three times a day (TID) | ORAL | 0 refills | Status: DC

## 2016-07-28 MED ORDER — DIPHENHYDRAMINE HCL 12.5 MG/5ML PO ELIX
25.0000 mg | ORAL_SOLUTION | Freq: Once | ORAL | Status: AC
  Administered 2016-07-28: 25 mg via ORAL
  Filled 2016-07-28: qty 10

## 2016-07-28 MED ORDER — DEXAMETHASONE 4 MG PO TABS: 4.0000 mg | ORAL_TABLET | Freq: Two times a day (BID) | ORAL | 0 refills | Status: DC

## 2016-07-28 MED ORDER — DICLOFENAC SODIUM 75 MG PO TBEC: 75.0000 mg | DELAYED_RELEASE_TABLET | Freq: Two times a day (BID) | ORAL | 0 refills | Status: DC

## 2016-07-28 MED ORDER — MORPHINE SULFATE (PF) 4 MG/ML IV SOLN
8.0000 mg | Freq: Once | INTRAVENOUS | Status: AC
  Administered 2016-07-28: 8 mg via INTRAMUSCULAR
  Filled 2016-07-28: qty 2

## 2016-07-28 MED ORDER — TRAMADOL HCL 50 MG PO TABS: ORAL_TABLET | ORAL | 0 refills | Status: DC

## 2016-07-28 MED ORDER — KETOROLAC TROMETHAMINE 60 MG/2ML IM SOLN
60.0000 mg | Freq: Once | INTRAMUSCULAR | Status: AC
  Administered 2016-07-28: 60 mg via INTRAMUSCULAR
  Filled 2016-07-28: qty 2

## 2016-07-28 NOTE — ED Triage Notes (Addendum)
Pt c/o right flank pain that radiates around to RLQ pain, dysuria, nausea, slight fever (unknown actual temp) for over a week. Pt reports decreased urine output starting today. Pt was seen at another ED recently and was given Flomax and told he had a kidney stone. Pt reports that pain hasn't gotten any better.

## 2016-07-28 NOTE — ED Provider Notes (Signed)
[facility] Provider Note   CSN: [id number] Arrival date & time: 07/28/16  2014     History   Chief Complaint No chief complaint on file.   HPI [name] is a 46 y.o. male.  Patient is a 46 year old male who presents to the emergency department with complaint of right flank pain.  Patient states that he has had some right side pain for nearly a week. He felt as though he had some difficulty with urination. Not bringing out as much urine as usual. The patient states he was seen at another emergency department and was told that he had blood in his urine he was placed on Flomax and other medications to help with a possible kidney stone. He states he was not referred to a urologist or to anyone else. He states that the Flomax is not helping. He actually feels his pain is worse. His pain is aggravated by change of position, walking, twisting twisting or bending. The patient has not seen any flank blood in his urine he is not had any high fever reported. He states however that the pain is getting progressively worse and now he is beginning to actually shake because of the intensity of the pain.        Past Medical History:  Diagnosis Date  . Back pain   . Bipolar 1 disorder (HCC)   . Chronic back pain   . Depression     There are no active problems to display for this patient.   Past Surgical History:  Procedure Laterality Date  . FOOT SURGERY         Home Medications    Prior to Admission medications   Medication Sig Start Date End Date Taking? Authorizing Provider  HYDROcodone-acetaminophen (NORCO/VICODIN) 5-325 MG tablet Take 1 tablet by mouth every 4 (four) hours as needed. 12/05/15   Burgess Amor, PA-C    Family History No family history on file.  Social History Social History  Substance Use Topics  . Smoking status: Current Every Day Smoker    Packs/day: 0.50    Types: Cigarettes  . Smokeless tobacco: Never Used  . Alcohol use No      Allergies   Morphine and related   Review of Systems Review of Systems  Constitutional: Negative for activity change, appetite change and fever.  HENT: Negative for congestion, ear discharge, ear pain, facial swelling, nosebleeds, rhinorrhea, sneezing and tinnitus.   Eyes: Negative for photophobia, pain and discharge.  Respiratory: Negative for cough, choking, shortness of breath and wheezing.   Cardiovascular: Negative for chest pain, palpitations and leg swelling.  Gastrointestinal: Negative for abdominal pain, blood in stool, constipation, diarrhea, nausea and vomiting.  Genitourinary: Positive for dysuria and flank pain. Negative for difficulty urinating, frequency and hematuria.  Musculoskeletal: Positive for back pain. Negative for gait problem, myalgias and neck pain.  Skin: Negative for color change, rash and wound.  Neurological: Negative for dizziness, seizures, syncope, facial asymmetry, speech difficulty, weakness and numbness.  Hematological: Negative for adenopathy. Does not bruise/bleed easily.  Psychiatric/Behavioral: Negative for agitation, confusion, hallucinations, self-injury and suicidal ideas. The patient is not nervous/anxious.      Physical Exam Updated Vital Signs There were no vitals taken for this visit.  Physical Exam  Constitutional: He is oriented to person, place, and time. He appears well-developed and well-nourished.  Non-toxic appearance.  HENT:  Head: Normocephalic.  Right Ear: Tympanic membrane and external ear normal.  Left Ear: Tympanic membrane and external ear normal.  Eyes: EOM and lids are normal. Pupils are equal, round, and reactive to light.  Neck: Normal range of motion. Neck supple. Carotid bruit is not present.  Cardiovascular: Normal rate, regular rhythm, normal heart sounds, intact distal pulses and normal pulses.   Pulmonary/Chest: Breath sounds normal. No respiratory distress.  Abdominal: Soft. Bowel sounds are normal.  There is no tenderness. There is CVA tenderness. There is no guarding.  Right CVAT  Musculoskeletal: Normal range of motion.       Lumbar back: He exhibits pain.       Back:  Lymphadenopathy:       Head (right side): No submandibular adenopathy present.       Head (left side): No submandibular adenopathy present.    He has no cervical adenopathy.  Neurological: He is alert and oriented to person, place, and time. He has normal strength. No cranial nerve deficit or sensory deficit.  Skin: Skin is warm and dry.  Psychiatric: He has a normal mood and affect. His speech is normal.  Nursing note and vitals reviewed.    ED Treatments / Results  Labs (all labs ordered are listed, but only abnormal results are displayed) Labs Reviewed - No data to display  EKG  EKG Interpretation None       Radiology Ct Renal Stone Study  Result Date: 07/28/2016 CLINICAL DATA:  Right flank pain for 1 week EXAM: CT ABDOMEN AND PELVIS WITHOUT CONTRAST TECHNIQUE: Multidetector CT imaging of the abdomen and pelvis was performed following the standard protocol without IV contrast. COMPARISON:  12/05/2015 FINDINGS: Lower chest: Lung bases demonstrate no acute consolidation or pleural effusion. The heart size is within normal limits. Hepatobiliary: No focal liver abnormality is seen. No gallstones, gallbladder wall thickening, or biliary dilatation. Pancreas: Unremarkable. No pancreatic ductal dilatation or surrounding inflammatory changes. Spleen: Normal in size without focal abnormality. Adrenals/Urinary Tract: Adrenal glands are within normal limits. Increased density within the bilateral renal pyramids bilaterally, no well-formed stone on the right. There is a punctate stone in the mid left kidney best seen on coronal views. No hydronephrosis. No ureteral stones. The bladder is normal. Stomach/Bowel: Stomach is within normal limits. Appendix contains small stones but no inflammatory change No evidence of bowel  wall thickening, distention, or inflammatory changes. Vascular/Lymphatic: Aortic atherosclerosis. No enlarged abdominal or pelvic lymph nodes. Reproductive: Prostate is unremarkable. Other: No free air or free fluid. Musculoskeletal: Degenerative changes with endplate changes at L5-S1 as before. No acute or suspicious bone lesion IMPRESSION: 1. Negative for hydronephrosis or ureteral stone. Punctate non obstructing stone in the mid left kidney 2. Small stones or radiodense material in the appendix but no evidence for acute appendicitis Electronically Signed   By: Jasmine Pang M.D.   On: 07/28/2016 19:55    Procedures Procedures (including critical care time)  Medications Ordered in ED Medications  morphine 4 MG/ML injection 8 mg (8 mg Intramuscular Given 07/28/16 2053)  diphenhydrAMINE (BENADRYL) 12.5 MG/5ML elixir 25 mg (25 mg Oral Given 07/28/16 2052)  ketorolac (TORADOL) injection 60 mg (60 mg Intramuscular Given 07/28/16 2053)     Initial Impression / Assessment and Plan / ED Course  I have reviewed the triage vital signs and the nursing notes.  Pertinent labs & imaging results that were available during my care of the patient were reviewed by me and considered in my medical decision making (see chart for details).       Final Clinical Impressions(s) / ED Diagnoses MDM Vital signs reviewed. Patient complains  that his pain is worse with movement. His pain is worse with changing of positions. He states that since being in the emergency department he is able to urinate. He has passed more urine than he has in the last 2 or 3 days. The urinalysis shows a specific gravity be elevated at greater than 1.033. There is negative blood. There is 0-5 red blood cells and 0-5 white blood cells.  The CT scan is negative for hydronephrosis or ureteral stone. There is a punctate nonobstructing stone in the mid left kidney. It is also of note that the patient has degenerative changes and degenerative  disc disease involving the lumbar spine. Particularly at the L5-S1 area.  I discussed the findings with the patient in terms which he understands. We discussed the fact that tonight's pain may be more related to his back the arthritis changes in the degenerative changes than it is to kidney stone or ureteral colic. I've asked the patient to see Dr. Eulah PontMurphy for orthopedic evaluation concerning his back. Prescriptions for Flexeril, Decadron, diclofenac, and Ultram given to the patient. Also asked patient to use a heating pad to the area. The patient is in agreement with this plan.    Final diagnoses:  DDD (degenerative disc disease), lumbar  Acute right-sided low back pain with sciatica, sciatica laterality unspecified    New Prescriptions New Prescriptions   No medications on file     Duayne CalBryant, Shamiah Kahler, PA-C 07/28/16 2136    Donnetta Hutchingook, Brian, MD 07/29/16 [phone]

## 2016-07-28 NOTE — Discharge Instructions (Signed)
Your vital signs are within normal limits. Your urine analysis is negative for blood in the urine or signs of infection. It does show that you are mildly dehydrated. Please increase water, juices, Gatorade, etc. Your CT scan is negative for ureteral stone on. There is a very tiny stone in your left kidney, but it remains in the kidney, and not in the ureter. Your CT scan also reveals arthritis and degenerative disc disease, particularly at the L5-S1 area.  I believe that her pain may be more related to your back with the degenerative disc disease and arthritis than from problems with your kidney or kidney stone. Please see Dr. Eulah PontMurphy for evaluation concerning this. A heating pad may be helpful. Please use Flexeril 3 times daily, use Decadron 2 times daily, use diclofenac 2 times daily, and use Ultram every 6 hours if needed for pain. Flexeril and Ultram may cause drowsiness, please do not drive, trach alcohol, operate machinery, or participate in activities requiring concentration when taking this medication.

## 2016-07-28 NOTE — ED Notes (Signed)
Pt was discharged under error.

## 2016-12-25 ENCOUNTER — Encounter (HOSPITAL_COMMUNITY): Payer: Self-pay | Admitting: Emergency Medicine

## 2016-12-25 ENCOUNTER — Emergency Department (HOSPITAL_COMMUNITY): Payer: Self-pay

## 2016-12-25 ENCOUNTER — Emergency Department (HOSPITAL_COMMUNITY)
Admission: EM | Admit: 2016-12-25 | Discharge: 2016-12-25 | Disposition: A | Discharge status: Home / Self Care | Payer: Self-pay | Attending: Emergency Medicine | Admitting: Emergency Medicine

## 2016-12-25 DIAGNOSIS — R519 Headache, unspecified: Secondary | ICD-10-CM

## 2016-12-25 DIAGNOSIS — F17210 Nicotine dependence, cigarettes, uncomplicated: Secondary | ICD-10-CM | POA: Insufficient documentation

## 2016-12-25 DIAGNOSIS — Z79899 Other long term (current) drug therapy: Secondary | ICD-10-CM | POA: Insufficient documentation

## 2016-12-25 DIAGNOSIS — M4722 Other spondylosis with radiculopathy, cervical region: Secondary | ICD-10-CM | POA: Insufficient documentation

## 2016-12-25 DIAGNOSIS — R51 Headache: Secondary | ICD-10-CM

## 2016-12-25 LAB — CBC WITH DIFFERENTIAL/PLATELET
BASOS ABS: 0 10*3/uL (ref 0.0–0.1)
BASOS PCT: 0 %
EOS PCT: 1 %
Eosinophils Absolute: 0.1 10*3/uL (ref 0.0–0.7)
HCT: 40 % (ref 39.0–52.0)
Hemoglobin: 13.4 g/dL (ref 13.0–17.0)
Lymphocytes Relative: 35 %
Lymphs Abs: 2.2 10*3/uL (ref 0.7–4.0)
MCH: 31.8 pg (ref 26.0–34.0)
MCHC: 33.5 g/dL (ref 30.0–36.0)
MCV: 94.8 fL (ref 78.0–100.0)
MONO ABS: 0.5 10*3/uL (ref 0.1–1.0)
Monocytes Relative: 8 %
Neutro Abs: 3.4 10*3/uL (ref 1.7–7.7)
Neutrophils Relative %: 56 %
PLATELETS: 172 10*3/uL (ref 150–400)
RBC: 4.22 MIL/uL (ref 4.22–5.81)
RDW: 12.5 % (ref 11.5–15.5)
WBC: 6.2 10*3/uL (ref 4.0–10.5)

## 2016-12-25 LAB — URINALYSIS, ROUTINE W REFLEX MICROSCOPIC
BACTERIA UA: NONE SEEN
Bilirubin Urine: NEGATIVE
Glucose, UA: NEGATIVE mg/dL
Ketones, ur: NEGATIVE mg/dL
Leukocytes, UA: NEGATIVE
NITRITE: NEGATIVE
PROTEIN: NEGATIVE mg/dL
SQUAMOUS EPITHELIAL / LPF: NONE SEEN
Specific Gravity, Urine: 1.008 (ref 1.005–1.030)
pH: 6 (ref 5.0–8.0)

## 2016-12-25 LAB — COMPREHENSIVE METABOLIC PANEL
ALBUMIN: 3.6 g/dL (ref 3.5–5.0)
ALK PHOS: 56 U/L (ref 38–126)
ALT: 8 U/L — ABNORMAL LOW (ref 17–63)
AST: 11 U/L — ABNORMAL LOW (ref 15–41)
Anion gap: 7 (ref 5–15)
BILIRUBIN TOTAL: 0.6 mg/dL (ref 0.3–1.2)
BUN: 14 mg/dL (ref 6–20)
CALCIUM: 8.5 mg/dL — AB (ref 8.9–10.3)
CO2: 28 mmol/L (ref 22–32)
Chloride: 101 mmol/L (ref 101–111)
Creatinine, Ser: 0.93 mg/dL (ref 0.61–1.24)
GFR calc Af Amer: >60 mL/min (ref >60)
GFR calc non Af Amer: >60 mL/min (ref >60)
GLUCOSE: 109 mg/dL — AB (ref 65–99)
Potassium: 3.5 mmol/L (ref 3.5–5.1)
Sodium: 136 mmol/L (ref 135–145)
TOTAL PROTEIN: 6 g/dL — AB (ref 6.5–8.1)

## 2016-12-25 LAB — RAPID URINE DRUG SCREEN, HOSP PERFORMED
AMPHETAMINES: POSITIVE — AB
Barbiturates: NOT DETECTED
Benzodiazepines: POSITIVE — AB
COCAINE: NOT DETECTED
OPIATES: POSITIVE — AB
Tetrahydrocannabinol: POSITIVE — AB

## 2016-12-25 MED ORDER — PROCHLORPERAZINE EDISYLATE 5 MG/ML IJ SOLN
10.0000 mg | Freq: Once | INTRAMUSCULAR | Status: AC
Start: 2016-12-25 — End: 2016-12-25
  Administered 2016-12-25: 10 mg via INTRAVENOUS
  Filled 2016-12-25: qty 2

## 2016-12-25 MED ORDER — PREDNISONE 20 MG PO TABS: 20.0000 mg | ORAL_TABLET | Freq: Two times a day (BID) | ORAL | 0 refills | Status: DC

## 2016-12-25 MED ORDER — HYDROCODONE-ACETAMINOPHEN 5-325 MG PO TABS: 1.0000 | ORAL_TABLET | ORAL | 0 refills | Status: DC | PRN

## 2016-12-25 MED ORDER — SODIUM CHLORIDE 0.9 % IV BOLUS (SEPSIS)
1000.0000 mL | Freq: Once | INTRAVENOUS | Status: AC
  Administered 2016-12-25: 1000 mL via INTRAVENOUS

## 2016-12-25 NOTE — Discharge Instructions (Signed)
Your headache appears to be caused by arthritis from your neck which is likely causing nerve impingement of a spinal root, which is why you are having pain in the shoulder as well.  Treatments for this include anti-inflammatory medicine, rest and heat.  We are giving you a prescription of prednisone, a strong anti-inflammatory medicine to start the treatment.  Also we are giving you hydrocodone, which is a narcotic pain reliever, which she cannot take when driving or working.  Follow-up with the doctor of your choice, for problems.  You can either see an orthopedist, or a primary care doctor.

## 2016-12-25 NOTE — ED Notes (Signed)
Pt transported to CT ?

## 2016-12-25 NOTE — ED Provider Notes (Signed)
[facility] EMERGENCY DEPARTMENT Provider Note   CSN: [id number] Arrival date & time: 12/25/16  1209     History   Chief Complaint Chief Complaint  Patient presents with  . Headache    HPI [name] is a 46 y.o. male.  He presents for evaluation of head, neck and left shoulder pain, which started yesterday.  No recent trauma.  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  He denies fever, chills, cough, shortness of breath, diaphoresis, back pain, dizziness, paresthesia, or difficulty walking.  No prior similar problem.  There are no other known modifying factors.  HPI  Past Medical History:  Diagnosis Date  . Back pain   . Bipolar 1 disorder (HCC)   . Chronic back pain   . Depression     There are no active problems to display for this patient.   Past Surgical History:  Procedure Laterality Date  . FOOT SURGERY         Home Medications    Prior to Admission medications   Medication Sig Start Date End Date Taking? Authorizing Provider  cyclobenzaprine (FLEXERIL) 10 MG tablet Take 1 tablet (10 mg total) by mouth 3 (three) times daily. 07/28/16   Ivery Quale, PA-C  dexamethasone (DECADRON) 4 MG tablet Take 1 tablet (4 mg total) by mouth 2 (two) times daily with a meal. 07/28/16   Ivery Quale, PA-C  diclofenac (VOLTAREN) 75 MG EC tablet Take 1 tablet (75 mg total) by mouth 2 (two) times daily. 07/28/16   Ivery Quale, PA-C  HYDROcodone-acetaminophen (NORCO) 5-325 MG tablet Take 1 tablet every 4 (four) hours as needed by mouth. 12/25/16   Mancel Bale, MD  predniSONE (DELTASONE) 20 MG tablet Take 1 tablet (20 mg total) 2 (two) times daily by mouth. 12/25/16   Mancel Bale, MD    Family History History reviewed. No pertinent family history.  Social History Social History   Tobacco Use  . Smoking status: Current Every Day Smoker    Packs/day: 0.50    Types: Cigarettes  . Smokeless tobacco: Never Used    Substance Use Topics  . Alcohol use: No  . Drug use: No     Allergies   Morphine and related   Review of Systems Review of Systems  All other systems reviewed and are negative.    Physical Exam Updated Vital Signs BP 111/78   Pulse 64   Temp 97.7 F (36.5 C) (Oral)   Resp 18   Ht 6' (1.829 m)   Wt 73.5 kg (162 lb)   SpO2 100%   BMI 21.97 kg/m   Physical Exam  Constitutional: He is oriented to person, place, and time. He appears well-developed and well-nourished. He does not appear ill. He appears distressed (He is uncomfortable).  HENT:  Head: Normocephalic and atraumatic.  Right Ear: External ear normal.  Left Ear: External ear normal.  Eyes: Conjunctivae and EOM are normal. Pupils are equal, round, and reactive to light.  Neck: Normal range of motion and phonation normal. Neck supple.  Cardiovascular: Normal rate, regular rhythm and normal heart sounds.  Pulmonary/Chest: Effort normal and breath sounds normal. He exhibits no bony tenderness.  Abdominal: Soft. There is no tenderness.  Musculoskeletal: Normal range of motion. He exhibits no edema or tenderness.  Neurological: He is alert and oriented to person, place, and time. No cranial nerve deficit or sensory deficit. He exhibits normal muscle tone. Coordination normal.  Skin: Skin is  warm, dry and intact.  Psychiatric: He has a normal mood and affect. His behavior is normal. Judgment and thought content normal.  Nursing note and vitals reviewed.    ED Treatments / Results  Labs (all labs ordered are listed, but only abnormal results are displayed) Labs Reviewed  URINALYSIS, ROUTINE W REFLEX MICROSCOPIC - Abnormal; Notable for the following components:      Result Value   Color, Urine STRAW (*)    Hgb urine dipstick SMALL (*)    All other components within normal limits  RAPID URINE DRUG SCREEN, HOSP PERFORMED - Abnormal; Notable for the following components:   Opiates POSITIVE (*)    Benzodiazepines  POSITIVE (*)    Amphetamines POSITIVE (*)    Tetrahydrocannabinol POSITIVE (*)    All other components within normal limits  COMPREHENSIVE METABOLIC PANEL - Abnormal; Notable for the following components:   Glucose, Bld 109 (*)    Calcium 8.5 (*)    Total Protein 6.0 (*)    AST 11 (*)    ALT 8 (*)    All other components within normal limits  CBC WITH DIFFERENTIAL/PLATELET    EKG  EKG Interpretation  Date/Time:  Wednesday December 25 2016 13:52:48 EST Ventricular Rate:  63 PR Interval:    QRS Duration: 115 QT Interval:  439 QTC Calculation: 450 R Axis:   93 Text Interpretation:  Ectopic atrial rhythm Borderline short PR interval Nonspecific intraventricular conduction delay Probable inferior infarct, old Abnormal lateral Q waves Lead(s) I were not used for morphology analysis since last tracing no significant change Confirmed by Mancel Bale [phone]) on 12/25/2016 3:26:16 PM       Radiology Ct Head Wo Contrast  Result Date: 12/25/2016 CLINICAL DATA:  Headache with photophobia and neck pain.  Fall. EXAM: CT HEAD WITHOUT CONTRAST CT CERVICAL SPINE WITHOUT CONTRAST TECHNIQUE: Multidetector CT imaging of the head and cervical spine was performed following the standard protocol without intravenous contrast. Multiplanar CT image reconstructions of the cervical spine were also generated. COMPARISON:  CT head and CT cervical spine August 02, 2015 FINDINGS: CT HEAD FINDINGS Brain: The ventricles are normal in size and configuration. There is no intracranial mass, hemorrhage, extra-axial fluid collection, or midline shift. Gray-white compartments appear normal. No acute infarct evident. Vascular: There is no appreciable hyperdense vessel. There is no appreciable vascular calcification. Skull: Bony calvarium appears intact. Sinuses/Orbits: There is mild mucosal thickening in several ethmoid air cells bilaterally. Other visualized paranasal sinuses are clear. Orbits appear symmetric bilaterally.  Other: Mastoid air cells are clear. CT CERVICAL SPINE FINDINGS Alignment: There is mild cervical dextroscoliosis. There is no evidence spondylolisthesis. Skull base and vertebrae: Skull base and craniocervical junction regions appear normal. No evident fracture. No blastic or lytic bone lesions. Soft tissues and spinal canal: Prevertebral soft tissues and predental space regions are normal. There is no paraspinous lesion. There is no evident cord or canal hematoma. Disc levels: There is moderately severe disc space narrowing at C3-4 and C4-5. There are anterior and posterior osteophytes at C3, C4, and C5. There is multilevel facet hypertrophy. There is moderate exit foraminal narrowing on the right at C3-4 due to bony hypertrophy. There is exit foraminal narrowing at C4-5 bilaterally, more severe on the left than on the right due to facet hypertrophy. There is impression on the exiting nerve root on the left at C4-5. There is no disc extrusion. There is slight narrowing of the canal centrally at C4-5 due to bony hypertrophy. There is no  high-grade stenosis. Upper chest: There is mild bullous disease in the lung apices. No edema or consolidation in the visualized upper lung regions. Other: None IMPRESSION: CT head: Mild mucosal thickening in several ethmoid air cells. No intracranial mass, hemorrhage, or extra-axial fluid collection. Gray-white compartments appear normal. CT cervical spine: No fracture or spondylolisthesis. There is multilevel osteoarthritic change. There are bullae in the lung apices bilaterally. Electronically Signed   By: Bretta BangWilliam  Woodruff III M.D.   On: 12/25/2016 15:29   Ct Cervical Spine Wo Contrast  Result Date: 12/25/2016 CLINICAL DATA:  Headache with photophobia and neck pain.  Fall. EXAM: CT HEAD WITHOUT CONTRAST CT CERVICAL SPINE WITHOUT CONTRAST TECHNIQUE: Multidetector CT imaging of the head and cervical spine was performed following the standard protocol without intravenous  contrast. Multiplanar CT image reconstructions of the cervical spine were also generated. COMPARISON:  CT head and CT cervical spine August 02, 2015 FINDINGS: CT HEAD FINDINGS Brain: The ventricles are normal in size and configuration. There is no intracranial mass, hemorrhage, extra-axial fluid collection, or midline shift. Gray-white compartments appear normal. No acute infarct evident. Vascular: There is no appreciable hyperdense vessel. There is no appreciable vascular calcification. Skull: Bony calvarium appears intact. Sinuses/Orbits: There is mild mucosal thickening in several ethmoid air cells bilaterally. Other visualized paranasal sinuses are clear. Orbits appear symmetric bilaterally. Other: Mastoid air cells are clear. CT CERVICAL SPINE FINDINGS Alignment: There is mild cervical dextroscoliosis. There is no evidence spondylolisthesis. Skull base and vertebrae: Skull base and craniocervical junction regions appear normal. No evident fracture. No blastic or lytic bone lesions. Soft tissues and spinal canal: Prevertebral soft tissues and predental space regions are normal. There is no paraspinous lesion. There is no evident cord or canal hematoma. Disc levels: There is moderately severe disc space narrowing at C3-4 and C4-5. There are anterior and posterior osteophytes at C3, C4, and C5. There is multilevel facet hypertrophy. There is moderate exit foraminal narrowing on the right at C3-4 due to bony hypertrophy. There is exit foraminal narrowing at C4-5 bilaterally, more severe on the left than on the right due to facet hypertrophy. There is impression on the exiting nerve root on the left at C4-5. There is no disc extrusion. There is slight narrowing of the canal centrally at C4-5 due to bony hypertrophy. There is no high-grade stenosis. Upper chest: There is mild bullous disease in the lung apices. No edema or consolidation in the visualized upper lung regions. Other: None IMPRESSION: CT head: Mild mucosal  thickening in several ethmoid air cells. No intracranial mass, hemorrhage, or extra-axial fluid collection. Gray-white compartments appear normal. CT cervical spine: No fracture or spondylolisthesis. There is multilevel osteoarthritic change. There are bullae in the lung apices bilaterally. Electronically Signed   By: Bretta BangWilliam  Woodruff III M.D.   On: 12/25/2016 15:29    Procedures Procedures (including critical care time)  Medications Ordered in ED Medications  sodium chloride 0.9 % bolus 1,000 mL (1,000 mLs Intravenous New Bag/Given 12/25/16 1449)  prochlorperazine (COMPAZINE) injection 10 mg (10 mg Intravenous Given 12/25/16 1449)     Initial Impression / Assessment and Plan / ED Course  I have reviewed the triage vital signs and the nursing notes.  Pertinent labs & imaging results that were available during my care of the patient were reviewed by me and considered in my medical decision making (see chart for details).      Patient Vitals for the past 24 hrs:  BP Temp Temp src Pulse Resp SpO2  Height Weight  12/25/16 1526 111/78 - - 64 18 100 % - -  12/25/16 1419 125/87 - - 64 18 100 % - -  12/25/16 1217 (!) 138/96 97.7 F (36.5 C) Oral 63 14 99 % 6' (1.829 m) 73.5 kg (162 lb)    4:00 PM Reevaluation with update and discussion. After initial assessment and treatment, an updated evaluation reveals he states his headache is resolved and he has no additional complaints.  Findings discussed with patient and family member, all questions answered. Kito Cuffe L      Final Clinical Impressions(s) / ED Diagnoses   Final diagnoses:  Osteoarthritis of spine with radiculopathy, cervical region  Nonintractable headache, unspecified chronicity pattern, unspecified headache type   Headache likely secondary to degenerative joint disease cervical, with radiculopathy as likely cause for shoulder pain.  Doubt cervical myelopathy, fracture, meningitis, acute CNS abnormality or metabolic  instability.  Nursing Notes Reviewed/ Care Coordinated Applicable Imaging Reviewed Interpretation of Laboratory Data incorporated into ED treatment  The patient appears reasonably screened and/or stabilized for discharge and I doubt any other medical condition or other [facility] requiring further screening, evaluation, or treatment in the ED at this time prior to discharge.  Plan: Home Medications-OTC analgesia, as needed; Home Treatments-rest, heat therapy; return here if the recommended treatment, does not improve the symptoms; Recommended follow up-PCP, as needed, consider seeing orthopedics for further evaluation   ED Discharge Orders        Ordered    predniSONE (DELTASONE) 20 MG tablet  2 times daily     12/25/16 1607    HYDROcodone-acetaminophen (NORCO) 5-325 MG tablet  Every 4 hours PRN     12/25/16 1607       Mancel BaleWentz, Salisa Broz, MD 12/25/16 1622

## 2016-12-25 NOTE — ED Triage Notes (Signed)
Pt reports HA started on Monday with light sensitivity, N/, and blurred vision. Denies unilateral weakness, A &O X4. States he has used goody powders, tylenol without relief. States yesterday he was bending over and upon standing he saw "black spots and went out" falling onto L shoulder. Denies hitting head, immediately "came to" per pt.

## 2016-12-25 NOTE — ED Notes (Signed)
Pt given water per po challenge 

## 2017-04-27 ENCOUNTER — Encounter (HOSPITAL_COMMUNITY): Payer: Self-pay | Admitting: *Deleted

## 2017-04-27 ENCOUNTER — Emergency Department (HOSPITAL_COMMUNITY)
Admission: EM | Admit: 2017-04-27 | Discharge: 2017-04-27 | Disposition: A | Discharge status: Home / Self Care | Payer: Self-pay | Attending: Emergency Medicine | Admitting: Emergency Medicine

## 2017-04-27 DIAGNOSIS — M5430 Sciatica, unspecified side: Secondary | ICD-10-CM | POA: Insufficient documentation

## 2017-04-27 DIAGNOSIS — M5440 Lumbago with sciatica, unspecified side: Secondary | ICD-10-CM

## 2017-04-27 DIAGNOSIS — X500XXA Overexertion from strenuous movement or load, initial encounter: Secondary | ICD-10-CM | POA: Insufficient documentation

## 2017-04-27 DIAGNOSIS — F17210 Nicotine dependence, cigarettes, uncomplicated: Secondary | ICD-10-CM | POA: Insufficient documentation

## 2017-04-27 DIAGNOSIS — Y990 Civilian activity done for income or pay: Secondary | ICD-10-CM | POA: Insufficient documentation

## 2017-04-27 DIAGNOSIS — Y9389 Activity, other specified: Secondary | ICD-10-CM | POA: Insufficient documentation

## 2017-04-27 DIAGNOSIS — S39012A Strain of muscle, fascia and tendon of lower back, initial encounter: Secondary | ICD-10-CM | POA: Insufficient documentation

## 2017-04-27 DIAGNOSIS — Y929 Unspecified place or not applicable: Secondary | ICD-10-CM | POA: Insufficient documentation

## 2017-04-27 DIAGNOSIS — G8929 Other chronic pain: Secondary | ICD-10-CM | POA: Insufficient documentation

## 2017-04-27 DIAGNOSIS — Z79899 Other long term (current) drug therapy: Secondary | ICD-10-CM | POA: Insufficient documentation

## 2017-04-27 MED ORDER — TRAMADOL HCL 50 MG PO TABS
100.0000 mg | ORAL_TABLET | Freq: Once | ORAL | Status: AC
  Administered 2017-04-27: 100 mg via ORAL
  Filled 2017-04-27: qty 2

## 2017-04-27 MED ORDER — DEXAMETHASONE 4 MG PO TABS: 4.0000 mg | ORAL_TABLET | Freq: Two times a day (BID) | ORAL | 0 refills | Status: DC

## 2017-04-27 MED ORDER — IBUPROFEN 600 MG PO TABS: 600.0000 mg | ORAL_TABLET | Freq: Four times a day (QID) | ORAL | 0 refills | Status: DC

## 2017-04-27 MED ORDER — DIAZEPAM 5 MG PO TABS
10.0000 mg | ORAL_TABLET | Freq: Once | ORAL | Status: AC
  Administered 2017-04-27: 10 mg via ORAL
  Filled 2017-04-27: qty 2

## 2017-04-27 MED ORDER — ONDANSETRON HCL 4 MG PO TABS
4.0000 mg | ORAL_TABLET | Freq: Once | ORAL | Status: AC
  Administered 2017-04-27: 4 mg via ORAL
  Filled 2017-04-27: qty 1

## 2017-04-27 MED ORDER — DEXAMETHASONE SODIUM PHOSPHATE 10 MG/ML IJ SOLN
10.0000 mg | Freq: Once | INTRAMUSCULAR | Status: AC
  Administered 2017-04-27: 10 mg via INTRAMUSCULAR
  Filled 2017-04-27: qty 1

## 2017-04-27 MED ORDER — CYCLOBENZAPRINE HCL 10 MG PO TABS: 10.0000 mg | ORAL_TABLET | Freq: Three times a day (TID) | ORAL | 0 refills | Status: DC

## 2017-04-27 NOTE — Discharge Instructions (Signed)
Please rest her back is much as possible.  Heating pad will be helpful.  Please use ibuprofen every 6 hours, use Flexeril 3 times daily.This medication may cause drowsiness. Please do not drink, drive, or participate in activity that requires concentration while taking this medication.  Use Decadron 2 times daily with food.  It is important that she see the orthopedic physician for your back as soon as possible.

## 2017-04-27 NOTE — ED Notes (Signed)
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

## 2017-04-27 NOTE — ED Provider Notes (Signed)
[facility] EMERGENCY DEPARTMENT Provider Note   CSN: [id number] Arrival date & time: 04/27/17  1400     History   Chief Complaint Chief Complaint  Patient presents with  . Back Pain    HPI [name] is a 47 y.o. male.  The history is provided by the patient.  Back Pain   This is a new problem. The current episode started 2 days ago. The problem occurs constantly. The problem has been gradually worsening. The pain is associated with lifting heavy objects and twisting. The pain is present in the lumbar spine. The quality of the pain is described as shooting, aching and burning. The pain radiates to the left thigh and right thigh. The pain is moderate. The symptoms are aggravated by certain positions. The pain is the same all the time. Pertinent negatives include no chest pain, no fever, no abdominal pain, no bowel incontinence, no perianal numbness, no bladder incontinence and no dysuria. Associated symptoms comments: nausea. He has tried bed rest for the symptoms. The treatment provided no relief.    Past Medical History:  Diagnosis Date  . Back pain   . Bipolar 1 disorder (HCC)   . Chronic back pain   . Depression     There are no active problems to display for this patient.   Past Surgical History:  Procedure Laterality Date  . FOOT SURGERY         Home Medications    Prior to Admission medications   Medication Sig Start Date End Date Taking? Authorizing Provider  cyclobenzaprine (FLEXERIL) 10 MG tablet Take 1 tablet (10 mg total) by mouth 3 (three) times daily. 07/28/16   Ivery QualeBryant, Madaleine Simmon, PA-C  dexamethasone (DECADRON) 4 MG tablet Take 1 tablet (4 mg total) by mouth 2 (two) times daily with a meal. 07/28/16   Ivery QualeBryant, Jemar Paulsen, PA-C  diclofenac (VOLTAREN) 75 MG EC tablet Take 1 tablet (75 mg total) by mouth 2 (two) times daily. 07/28/16   Ivery QualeBryant, Gurkirat Basher, PA-C  HYDROcodone-acetaminophen (NORCO) 5-325 MG tablet Take 1 tablet every 4 (four) hours as needed by mouth.  12/25/16   Mancel BaleWentz, Elliott, MD  predniSONE (DELTASONE) 20 MG tablet Take 1 tablet (20 mg total) 2 (two) times daily by mouth. 12/25/16   Mancel BaleWentz, Elliott, MD    Family History History reviewed. No pertinent family history.  Social History Social History   Tobacco Use  . Smoking status: Current Every Day Smoker    Packs/day: 0.50    Types: Cigarettes  . Smokeless tobacco: Never Used  Substance Use Topics  . Alcohol use: No  . Drug use: No     Allergies   Morphine and related   Review of Systems Review of Systems  Constitutional: Negative for activity change and fever.       All ROS Neg except as noted in HPI  HENT: Negative for nosebleeds.   Eyes: Negative for photophobia and discharge.  Respiratory: Negative for cough, shortness of breath and wheezing.   Cardiovascular: Negative for chest pain and palpitations.  Gastrointestinal: Negative for abdominal pain, blood in stool and bowel incontinence.  Genitourinary: Negative for bladder incontinence, dysuria, frequency and hematuria.  Musculoskeletal: Positive for back pain. Negative for arthralgias and neck pain.  Skin: Negative.   Neurological: Negative for dizziness, seizures and speech difficulty.  Psychiatric/Behavioral: Negative for confusion and hallucinations.     Physical Exam Updated Vital Signs BP 119/82 (BP Location: Right Arm)   Pulse 82   Temp (S) 97.6 F (  36.4 C) (Oral)   Resp 16   Ht 6\' 1"  (1.854 m)   Wt 74.8 kg (165 lb)   SpO2 100%   BMI 21.77 kg/m   Physical Exam  Constitutional: He is oriented to person, place, and time. He appears well-developed and well-nourished.  Non-toxic appearance.  HENT:  Head: Normocephalic.  Right Ear: Tympanic membrane and external ear normal.  Left Ear: Tympanic membrane and external ear normal.  Eyes: EOM and lids are normal. Pupils are equal, round, and reactive to light.  Neck: Normal range of motion. Neck supple. Carotid bruit is not present.  Cardiovascular:  Normal rate, regular rhythm, normal heart sounds, intact distal pulses and normal pulses.  Pulmonary/Chest: Breath sounds normal. No respiratory distress.  Occasional wheezes noted.  Abdominal: Soft. Bowel sounds are normal. There is no tenderness. There is no guarding.  Musculoskeletal: Normal range of motion.  There is pain to palpation at the right and left lumbar paraspinal area.  There is pain to palpation of the lumbar spine.  There is no palpable step-off.  There are no hot areas appreciated.  Lymphadenopathy:       Head (right side): No submandibular adenopathy present.       Head (left side): No submandibular adenopathy present.    He has no cervical adenopathy.  Neurological: He is alert and oriented to person, place, and time. He has normal strength. No cranial nerve deficit or sensory deficit.  Gait is steady with good heel toe coordination.  Balance is within normal limits.  There are no sensory deficits, in particular there is no saddle area deficits to sensory appreciated.  Skin: Skin is warm and dry.  Psychiatric: He has a normal mood and affect. His speech is normal.  Nursing note and vitals reviewed.    ED Treatments / Results  Labs (all labs ordered are listed, but only abnormal results are displayed) Labs Reviewed - No data to display  EKG  EKG Interpretation None       Radiology No results found.  Procedures Procedures (including critical care time)  Medications Ordered in ED Medications  diazepam (VALIUM) tablet 10 mg (not administered)  dexamethasone (DECADRON) injection 10 mg (not administered)  ondansetron (ZOFRAN) tablet 4 mg (not administered)  traMADol (ULTRAM) tablet 100 mg (not administered)     Initial Impression / Assessment and Plan / ED Course  I have reviewed the triage vital signs and the nursing notes.  Pertinent labs & imaging results that were available during my care of the patient were reviewed by me and considered in my medical  decision making (see chart for details).       Final Clinical Impressions(s) / ED Diagnoses MDM  Vital signs reviewed.  Pulse oximetry is 100% on room air.  Within normal limits by my interpretation.  No gross neurologic deficits appreciated on examination.  Patient treated in the emergency department with oral Valium, intramuscular Decadron, and oral Ultram.  After medication patient reports the pain is much improved.  There is no evidence for cauda equina or other emergent changes.  Patient is ambulatory without major problem.  I suspect that the patient is straightening of the lumbar area following moving sweeping heavy metal pieces.  I have asked the patient to rest his back is much as possible.  Use heat when possible.  Prescription for Flexeril, Decadron, and ibuprofen given to the patient.  Patient referred to orthopedics for additional evaluation and management.   Final diagnoses:  Strain of lumbar  region, initial encounter  Chronic low back pain with sciatica, sciatica laterality unspecified, unspecified back pain laterality    ED Discharge Orders        Ordered    dexamethasone (DECADRON) 4 MG tablet  2 times daily with meals     04/27/17 1455    cyclobenzaprine (FLEXERIL) 10 MG tablet  3 times daily     04/27/17 1455    ibuprofen (ADVIL,MOTRIN) 600 MG tablet  4 times daily     04/27/17 1455       Ivery Quale, PA-C 04/28/17 2018    Samuel Jester, DO 04/28/17 2215

## 2017-04-27 NOTE — ED Triage Notes (Signed)
Pt with lower back pain since Friday night, emesis on Friday and denies emesis today but + nausea. Per pt radiates down both legs, hx of same last year.

## 2017-05-19 ENCOUNTER — Ambulatory Visit (INDEPENDENT_AMBULATORY_CARE_PROVIDER_SITE_OTHER): Payer: Self-pay | Admitting: Orthopaedic Surgery

## 2017-05-19 ENCOUNTER — Other Ambulatory Visit (INDEPENDENT_AMBULATORY_CARE_PROVIDER_SITE_OTHER): Payer: Self-pay

## 2017-05-19 ENCOUNTER — Ambulatory Visit (INDEPENDENT_AMBULATORY_CARE_PROVIDER_SITE_OTHER): Payer: Self-pay

## 2017-05-19 ENCOUNTER — Encounter (INDEPENDENT_AMBULATORY_CARE_PROVIDER_SITE_OTHER): Payer: Self-pay | Admitting: Orthopaedic Surgery

## 2017-05-19 DIAGNOSIS — G8929 Other chronic pain: Secondary | ICD-10-CM

## 2017-05-19 DIAGNOSIS — M4807 Spinal stenosis, lumbosacral region: Secondary | ICD-10-CM

## 2017-05-19 DIAGNOSIS — M545 Low back pain: Secondary | ICD-10-CM

## 2017-05-19 MED ORDER — HYDROCODONE-ACETAMINOPHEN 5-325 MG PO TABS: 1.0000 | ORAL_TABLET | Freq: Four times a day (QID) | ORAL | 0 refills | Status: AC | PRN

## 2017-05-19 MED ORDER — CYCLOBENZAPRINE HCL 10 MG PO TABS: 10.0000 mg | ORAL_TABLET | Freq: Three times a day (TID) | ORAL | 0 refills | Status: DC
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
## 2017-06-02 ENCOUNTER — Ambulatory Visit (INDEPENDENT_AMBULATORY_CARE_PROVIDER_SITE_OTHER): Payer: Self-pay | Admitting: Orthopaedic Surgery

## 2017-06-05 ENCOUNTER — Other Ambulatory Visit (INDEPENDENT_AMBULATORY_CARE_PROVIDER_SITE_OTHER): Payer: Self-pay | Admitting: Orthopaedic Surgery

## 2017-06-05 ENCOUNTER — Telehealth (INDEPENDENT_AMBULATORY_CARE_PROVIDER_SITE_OTHER): Payer: Self-pay | Admitting: Orthopaedic Surgery

## 2017-06-05 NOTE — Telephone Encounter (Signed)
Please advise (He has appt on the 29th to review MRI)

## 2017-06-05 NOTE — Telephone Encounter (Signed)
Patient called stated in pain and called Pharmacy for refill for pain meds.HYDROcodone  Please call pat to advise.

## 2017-06-09 NOTE — Telephone Encounter (Signed)
Will you see if this one has already been printed/picked up?  It was on Thursday that it was answered.  Thanks.

## 2017-06-09 NOTE — Telephone Encounter (Signed)
This was sent in by Dr. Magnus IvanBlackman on 06/05/17.

## 2017-06-12 ENCOUNTER — Other Ambulatory Visit (INDEPENDENT_AMBULATORY_CARE_PROVIDER_SITE_OTHER): Payer: Self-pay | Admitting: Orthopaedic Surgery

## 2017-06-12 NOTE — Telephone Encounter (Signed)
Please advise 

## 2017-06-16 ENCOUNTER — Encounter (INDEPENDENT_AMBULATORY_CARE_PROVIDER_SITE_OTHER): Payer: Self-pay | Admitting: Orthopaedic Surgery

## 2017-06-16 ENCOUNTER — Ambulatory Visit (INDEPENDENT_AMBULATORY_CARE_PROVIDER_SITE_OTHER): Payer: Self-pay | Admitting: Orthopaedic Surgery

## 2017-06-16 ENCOUNTER — Telehealth (INDEPENDENT_AMBULATORY_CARE_PROVIDER_SITE_OTHER): Payer: Self-pay | Admitting: Orthopaedic Surgery

## 2017-06-16 DIAGNOSIS — G8929 Other chronic pain: Secondary | ICD-10-CM

## 2017-06-16 DIAGNOSIS — M4807 Spinal stenosis, lumbosacral region: Secondary | ICD-10-CM

## 2017-06-16 DIAGNOSIS — M545 Low back pain: Secondary | ICD-10-CM

## 2017-06-16 MED ORDER — TEMAZEPAM 7.5 MG PO CAPS: 7.5000 mg | ORAL_CAPSULE | Freq: Every evening | ORAL | 0 refills | Status: AC | PRN

## 2017-06-16 NOTE — Telephone Encounter (Signed)
Patient called asked if a Rx was sent in to the [facility] to help him sleep. The number to contact patient is [phone]

## 2017-06-16 NOTE — Progress Notes (Signed)
The patient is coming in today for follow-up however he has not had the MRI of his lumbar spine.  Multiple attempts to get older in the office were unsuccessful and he said his phone was off her had some issues with it.  He still having problems with sleeping at night.  We know he is got significant degenerative disc disease and lumbar spine is worse in 07/2013 an MRI is warranted given the non-sciatica is having to determine what the best plan of action would be at this point.  He understands that we can keep him on narcotic pain medication.  Nothing else is changed medical status other than severe pain.  He has information now to be set up for the MRI and he is going to call.  His exam is otherwise equivocal in terms of the pain in his legs on exam with no significant weakness but numbness and tingling.  We will see him back after the MRI.  I will send in some Restoril to help him sleep.  Please refer to his office note from his last visit for getting clearance for the MRI.

## 2017-06-16 NOTE — Telephone Encounter (Signed)
Please advise 

## 2017-06-16 NOTE — Telephone Encounter (Signed)
I escribed it in 

## 2017-06-17 ENCOUNTER — Telehealth (INDEPENDENT_AMBULATORY_CARE_PROVIDER_SITE_OTHER): Payer: Self-pay | Admitting: Orthopaedic Surgery

## 2017-06-17 ENCOUNTER — Other Ambulatory Visit (INDEPENDENT_AMBULATORY_CARE_PROVIDER_SITE_OTHER): Payer: Self-pay | Admitting: Orthopaedic Surgery

## 2017-06-17 NOTE — Telephone Encounter (Signed)
This was initially sent to that pharmacy

## 2017-06-17 NOTE — Telephone Encounter (Signed)
Called into pharmacy

## 2017-06-17 NOTE — Telephone Encounter (Signed)
Ok for one 5 mg valium tablet to take 30 minutes prior to his MRI

## 2017-06-17 NOTE — Telephone Encounter (Signed)
Valium ok?

## 2017-06-17 NOTE — Telephone Encounter (Signed)
Patient said [facility] has not received rx

## 2017-06-17 NOTE — Telephone Encounter (Signed)
Patient called back stating that his medication needs to be sent to [facility].  Please resend the Restoril to that pharmacy.  Thank you.

## 2017-06-17 NOTE — Telephone Encounter (Signed)
Patient called left voicemail message that he will need a Rx when he go for his MRI Jun 22, 2017. The number to contact patient is [phone]

## 2017-06-18 ENCOUNTER — Other Ambulatory Visit (INDEPENDENT_AMBULATORY_CARE_PROVIDER_SITE_OTHER): Payer: Self-pay | Admitting: Orthopaedic Surgery

## 2017-06-18 MED ORDER — HYDROCODONE-ACETAMINOPHEN 5-325 MG PO TABS: 1.0000 | ORAL_TABLET | Freq: Four times a day (QID) | ORAL | 0 refills | Status: DC | PRN

## 2017-06-22 ENCOUNTER — Ambulatory Visit
Admission: RE | Admit: 2017-06-22 | Discharge: 2017-06-22 | Disposition: A | Discharge status: Home / Self Care | Payer: No Typology Code available for payment source | Source: Ambulatory Visit | Attending: Orthopaedic Surgery | Admitting: Orthopaedic Surgery

## 2017-06-22 DIAGNOSIS — M4807 Spinal stenosis, lumbosacral region: Secondary | ICD-10-CM

## 2017-06-23 ENCOUNTER — Other Ambulatory Visit (INDEPENDENT_AMBULATORY_CARE_PROVIDER_SITE_OTHER): Payer: Self-pay | Admitting: Orthopaedic Surgery

## 2017-06-23 NOTE — Telephone Encounter (Signed)
Please advise 

## 2017-06-25 ENCOUNTER — Ambulatory Visit (INDEPENDENT_AMBULATORY_CARE_PROVIDER_SITE_OTHER): Payer: Self-pay | Admitting: Orthopaedic Surgery

## 2017-06-25 ENCOUNTER — Encounter (INDEPENDENT_AMBULATORY_CARE_PROVIDER_SITE_OTHER): Payer: Self-pay | Admitting: Orthopaedic Surgery

## 2017-06-25 DIAGNOSIS — M5136 Other intervertebral disc degeneration, lumbar region: Secondary | ICD-10-CM

## 2017-06-25 NOTE — Progress Notes (Signed)
The patient is coming in for review of MRI of his lumbar spine.  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  We have had him on for a short course of hydrocodone and he knows to use it sparingly and he knows that we will keep refilling this.  It did warrant obtaining an MRI of the lumbar spine due to worsening low back pain with some radicular symptoms but it seems to be mostly in the low back.  He is here for review this today.  On exam again is a very thin and cachectic individual.  He is got good strength in his bowel or extremities but severe back pain with flexion extension of the lumbar spine.  The MRI is seen in the radiology program to abdominal CT scan from last year and does show worsening of severe degenerative disc disease at L5-S1.  There is narrowing of the foramina bilaterally at that level and superior endplate collapse.  This is slowly worsened with time.  This creates with slight anterolisthesis as well.  With this point we will at least have Dr. Ophelia Charter or Dr. Otelia Sergeant take a look at him for an opinion of whether or not he would benefit from a surgical intervention such as an L5-S1 fusion versus potential any other indication for surgery.  He understands that he is being seen by them to potentially consider surgery versus a likelihood of him not having any type of operative indication is a potentially would benefit from activity modification and potentially injections in the spine but we need a spine surgeon's opinion about this as well.

## 2017-06-26 ENCOUNTER — Telehealth (INDEPENDENT_AMBULATORY_CARE_PROVIDER_SITE_OTHER): Payer: Self-pay | Admitting: Orthopaedic Surgery

## 2017-06-27 ENCOUNTER — Telehealth (INDEPENDENT_AMBULATORY_CARE_PROVIDER_SITE_OTHER): Payer: Self-pay | Admitting: Orthopaedic Surgery

## 2017-06-27 NOTE — Telephone Encounter (Signed)
Patient called back stating that the pharmacy advised to patient that Dr. Magnus Ivan has denied the refill.  Patient also states that he is in a lot of pain and needs his medication, he has been up most of the night because of the pain, over the counter medication does not help the pain.  [phone].  Thank you.

## 2017-06-27 NOTE — Telephone Encounter (Signed)
I tried to call patient and his number is not available and I cannot leave a message, mailbox full.  Rx for #30 hydrocodone was done on 06/23/17.  Not sure if patient is aware?  Will discuss if patient calls back.

## 2017-06-27 NOTE — Telephone Encounter (Signed)
Message sent in error

## 2017-06-30 ENCOUNTER — Telehealth (INDEPENDENT_AMBULATORY_CARE_PROVIDER_SITE_OTHER): Payer: Self-pay | Admitting: Orthopaedic Surgery

## 2017-06-30 NOTE — Telephone Encounter (Signed)
See below

## 2017-06-30 NOTE — Telephone Encounter (Signed)
Patients phone must be dead, it goes straight it voicemail and his mailbox is full, please let him know when he calls back, no narcotics at this standpoint per Dr. Magnus Ivan

## 2017-06-30 NOTE — Telephone Encounter (Signed)
No more narcotics at this standpoint due to narcotics laws

## 2017-06-30 NOTE — Telephone Encounter (Signed)
Patient called back advised he is out of pain medicine. I advised Toniann Fail tried to call him back but could not leave a message for him (Mailbox was full). I read message from Toniann Fail to patient. Patient said he is in a lot of pain and will try to go to work today. The number to contact patient is [phone]

## 2017-07-01 ENCOUNTER — Telehealth (INDEPENDENT_AMBULATORY_CARE_PROVIDER_SITE_OTHER): Payer: Self-pay | Admitting: Orthopaedic Surgery

## 2017-07-01 NOTE — Telephone Encounter (Signed)
Terri gave this to you I believe

## 2017-07-01 NOTE — Telephone Encounter (Signed)
Called patient to give appt and time & date-Voicemail full and not taking any message at this time.  Patient sch w/Yates [phone]

## 2017-07-01 NOTE — Telephone Encounter (Signed)
Patient called wanting to know if Dr Rayburn Ma wants him to see Ophelia Charter or Otelia Sergeant.  Patient states in severe pain.   Please call to advise.

## 2017-07-15 ENCOUNTER — Encounter (HOSPITAL_COMMUNITY): Payer: Self-pay | Admitting: *Deleted

## 2017-07-15 ENCOUNTER — Emergency Department (HOSPITAL_COMMUNITY)
Admission: EM | Admit: 2017-07-15 | Discharge: 2017-07-15 | Disposition: A | Discharge status: Home / Self Care | Payer: Self-pay | Attending: Emergency Medicine | Admitting: Emergency Medicine

## 2017-07-15 ENCOUNTER — Emergency Department (HOSPITAL_COMMUNITY): Payer: Self-pay

## 2017-07-15 DIAGNOSIS — Z79899 Other long term (current) drug therapy: Secondary | ICD-10-CM | POA: Insufficient documentation

## 2017-07-15 DIAGNOSIS — R51 Headache: Secondary | ICD-10-CM | POA: Insufficient documentation

## 2017-07-15 DIAGNOSIS — H538 Other visual disturbances: Secondary | ICD-10-CM

## 2017-07-15 DIAGNOSIS — F17210 Nicotine dependence, cigarettes, uncomplicated: Secondary | ICD-10-CM | POA: Insufficient documentation

## 2017-07-15 DIAGNOSIS — R519 Headache, unspecified: Secondary | ICD-10-CM

## 2017-07-15 HISTORY — DX: Migraine, unspecified, not intractable, without status migrainosus: G43.909

## 2017-07-15 MED ORDER — SODIUM CHLORIDE 0.9 % IV BOLUS
1000.0000 mL | Freq: Once | INTRAVENOUS | Status: AC
  Administered 2017-07-15: 1000 mL via INTRAVENOUS

## 2017-07-15 MED ORDER — PROCHLORPERAZINE EDISYLATE 10 MG/2ML IJ SOLN
10.0000 mg | Freq: Once | INTRAMUSCULAR | Status: AC
  Administered 2017-07-15: 10 mg via INTRAVENOUS
  Filled 2017-07-15: qty 2

## 2017-07-15 MED ORDER — KETOROLAC TROMETHAMINE 30 MG/ML IJ SOLN
15.0000 mg | Freq: Once | INTRAMUSCULAR | Status: AC
  Administered 2017-07-15: 15 mg via INTRAVENOUS
  Filled 2017-07-15: qty 1

## 2017-07-15 MED ORDER — DEXAMETHASONE SODIUM PHOSPHATE 10 MG/ML IJ SOLN
10.0000 mg | Freq: Once | INTRAMUSCULAR | Status: AC
  Administered 2017-07-15: 10 mg via INTRAVENOUS
  Filled 2017-07-15: qty 1

## 2017-07-15 NOTE — ED Notes (Addendum)
[facility] ED sec. notified [employer] to request they make house visit to see if IV still present. [employer] reports pt lives in [place] and they will refer request to [employer].

## 2017-07-15 NOTE — ED Triage Notes (Signed)
Pt with HA on Friday while outside work, ha continued up until today but woke up with blurry vision, vision is still blurry per pt but has improved some.  Dizziness over all weekend, at times feels like he will pass out.

## 2017-07-15 NOTE — ED Notes (Addendum)
Upon entering room- pt and family not in room. Burnett Harry, Diplomatic Services operational officer and [employer]. Charge nurse visualized pt walking out.  Pt did not notify staff prior to leaving. Pt did have IV to right AC- no remnants of IV found in trash. [employer] have been notified.  Dr Jeraldine Loots made aware.

## 2017-07-15 NOTE — ED Notes (Signed)
[place] police notified [name], ED Diplomatic Services operational officer and informed that they did verify that pt did not have an IV in his arm. Also report that pt said he took it out himself. Inetta Fermo, charge nurse, Dr Jeraldine Loots, and [employer] notified.

## 2017-07-15 NOTE — ED Provider Notes (Signed)
[name] EMERGENCY DEPARTMENT Provider Note   CSN: [id number] Arrival date & time: 07/15/17  1450     History   Chief Complaint Chief Complaint  Patient presents with  . Headache    blurry vision    HPI [name] is a 47 y.o. male.  HPI Patient presents with concern of headache and blurry vision. Patient does not have a history of headaches, chronically according to him and family member with him. He notes that over the past 2 days he has developed persistent pain in the posterior head neck, with radiation along the temporal region, also on the right side. Pain is sore, severe, not improved with OTC medication. There is new blurry vision, that the patient cannot specify as coming from one eye or the other. No confusion, no disorientation, some lightheadedness, some dizziness, particularly with activity. No nausea, vomiting, diarrhea.  Patient denies recent medication change, diet change, activity change. Patient smokes cigarettes, does not drink alcohol.  Past Medical History:  Diagnosis Date  . Back pain   . Bipolar 1 disorder (HCC)   . Chronic back pain   . Depression   . Migraine     There are no active problems to display for this patient.   Past Surgical History:  Procedure Laterality Date  . FOOT SURGERY          Home Medications    Prior to Admission medications   Medication Sig Start Date End Date Taking? Authorizing Provider  cyclobenzaprine (FLEXERIL) 10 MG tablet Take 1 tablet (10 mg total) by mouth 3 (three) times daily. 05/19/17   Kathryne Hitch, MD  dexamethasone (DECADRON) 4 MG tablet Take 1 tablet (4 mg total) by mouth 2 (two) times daily with a meal. 04/27/17   Ivery Quale, PA-C  diclofenac (VOLTAREN) 75 MG EC tablet Take 1 tablet (75 mg total) by mouth 2 (two) times daily. 07/28/16   Ivery Quale, PA-C  HYDROcodone-acetaminophen (NORCO/VICODIN) 5-325 MG tablet TAKE 1 TO 2 TABLETS BY MOUTH EVERY 6 HOURS AS NEEDED FOR  MODERATE PAIN 06/23/17   Kathryne Hitch, MD  ibuprofen (ADVIL,MOTRIN) 600 MG tablet Take 1 tablet (600 mg total) by mouth 4 (four) times daily. 04/27/17   Ivery Quale, PA-C  predniSONE (DELTASONE) 20 MG tablet Take 1 tablet (20 mg total) 2 (two) times daily by mouth. 12/25/16   Mancel Bale, MD  temazepam (RESTORIL) 7.5 MG capsule Take 1 capsule (7.5 mg total) by mouth at bedtime as needed for sleep. 06/16/17   Kathryne Hitch, MD    Family History History reviewed. No pertinent family history.  Social History Social History   Tobacco Use  . Smoking status: Current Every Day Smoker    Packs/day: 0.50    Types: Cigarettes  . Smokeless tobacco: Never Used  Substance Use Topics  . Alcohol use: No  . Drug use: No     Allergies   Morphine and related   Review of Systems Review of Systems  Constitutional:       Per HPI, otherwise negative  HENT:       Per HPI, otherwise negative  Eyes: Positive for visual disturbance.  Respiratory:       Per HPI, otherwise negative  Cardiovascular:       Per HPI, otherwise negative  Gastrointestinal: Negative for vomiting.  Endocrine:       Negative aside from HPI  Genitourinary:       Neg aside from HPI   Musculoskeletal:  Per HPI, otherwise negative  Skin: Negative.   Neurological: Positive for headaches. Negative for syncope.     Physical Exam Updated Vital Signs BP 128/89   Pulse 65   Temp 98 F (36.7 C) (Oral)   Resp 16   Ht  (1.854 m)   Wt 74.8 kg (165 lb)   SpO2 100%   BMI 21.77 kg/m   Physical Exam  Constitutional: He is oriented to person, place, and time. He appears well-developed. No distress.  HENT:  Head: Normocephalic and atraumatic.  Eyes: Conjunctivae and EOM are normal.  Cardiovascular: Normal rate and regular rhythm.  Pulmonary/Chest: Effort normal. No stridor. No respiratory distress.  Abdominal: He exhibits no distension.  Musculoskeletal: He exhibits no edema.    Neurological: He is alert and oriented to person, place, and time. He displays no atrophy and no tremor. He exhibits normal muscle tone. He displays no seizure activity. Coordination normal.  Skin: Skin is warm and dry.  Psychiatric: He has a normal mood and affect.  Nursing note and vitals reviewed.    ED Treatments / Results  Labs (all labs ordered are listed, but only abnormal results are displayed) Labs Reviewed - No data to display  EKG None  Radiology No results found.  Procedures Procedures (including critical care time)  Medications Ordered in ED Medications  sodium chloride 0.9 % bolus 1,000 mL (has no administration in time range)  prochlorperazine (COMPAZINE) injection 10 mg (has no administration in time range)  ketorolac (TORADOL) 30 MG/ML injection 15 mg (has no administration in time range)  dexamethasone (DECADRON) injection 10 mg (has no administration in time range)     Initial Impression / Assessment and Plan / ED Course  I have reviewed the triage vital signs and the nursing notes.  Pertinent labs & imaging results that were available during my care of the patient were reviewed by me and considered in my medical decision making (see chart for details).     7:42 PM After the initial evaluation and prior to having his head CT, seemingly after the patient received his medication, and IV administration, the patient departed, was not able to be located by nursing staff, nor myself. The patient was ambulatory, moving all extremities, description of new blurry vision, and atypical headache was concerning and warranted additional evaluation.  Final Clinical Impressions(s) / ED Diagnoses  Bad headache Blurry vision   Gerhard Munch, MD 07/15/17 1943

## 2017-07-16 ENCOUNTER — Ambulatory Visit (INDEPENDENT_AMBULATORY_CARE_PROVIDER_SITE_OTHER): Payer: Self-pay | Admitting: Orthopaedic Surgery
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
## 2021-11-01 DIAGNOSIS — I10 Essential (primary) hypertension: Secondary | ICD-10-CM | POA: Diagnosis not present

## 2021-11-01 DIAGNOSIS — M549 Dorsalgia, unspecified: Secondary | ICD-10-CM | POA: Diagnosis not present

## 2022-03-01 DIAGNOSIS — M549 Dorsalgia, unspecified: Secondary | ICD-10-CM | POA: Diagnosis not present

## 2022-03-01 DIAGNOSIS — J329 Chronic sinusitis, unspecified: Secondary | ICD-10-CM | POA: Diagnosis not present

## 2022-03-11 ENCOUNTER — Encounter: Payer: Self-pay | Admitting: Orthopaedic Surgery

## 2022-03-11 ENCOUNTER — Ambulatory Visit (INDEPENDENT_AMBULATORY_CARE_PROVIDER_SITE_OTHER): Payer: Medicaid Other | Admitting: Orthopaedic Surgery

## 2022-03-11 ENCOUNTER — Other Ambulatory Visit: Payer: Self-pay

## 2022-03-11 DIAGNOSIS — M5442 Lumbago with sciatica, left side: Secondary | ICD-10-CM

## 2022-03-11 DIAGNOSIS — M5441 Lumbago with sciatica, right side: Secondary | ICD-10-CM | POA: Diagnosis not present

## 2022-03-11 DIAGNOSIS — G8929 Other chronic pain: Secondary | ICD-10-CM | POA: Diagnosis not present

## 2022-03-11 NOTE — Progress Notes (Signed)
The patient is someone I saw back in 2019 with an MRI of his lumbar spine showing severe advanced degenerative disc disease at L5 5 S1 with bilateral foraminal narrowing.  He had had foraminal injections with ESI's and had done decently well.  He is always had chronic low back pain.  He had not been seen for several years and then in late July of last year he had a mechanical fall injuring his neck and his back.  In early August he was seen at an outlying emergency department and CT scan of his neck as well as thoracic and lumbar spines were obtained.  The radiologist noted severe degenerative changes like before L5-S1 but also noted sclerotic lesions that is slightly increased in size at T12 and L1.  The radiologist has recommended a MRI with contrast to further evaluate these lesions and thus he was sent here.  He has had no other acute change in medical status.  He says his pain but his back is getting worse.  He denies any change in bowel or bladder function.  He is a thin individual.  He does smoke.  He is on oxycodone through his primary care physician.  He says he is starting to get "sick to his stomach".  On exam he has negative straight leg raise bilaterally.  He does have severe pain in the lower aspect of his lumbar spine with flexion and extension.  I did review the CT scan and can see the sclerotic areas that the radiologist is referring to.  The patient also has severe degenerative changes at L5-S1.  Per the radiologist recommendations we need to obtain a MRI with contrast of the lumbar spine showing T12 or at least T11 down to L1 to better evaluate these lesions.  We will see him back in follow-up.

## 2022-04-03 DIAGNOSIS — M436 Torticollis: Secondary | ICD-10-CM | POA: Diagnosis not present

## 2022-04-03 DIAGNOSIS — M542 Cervicalgia: Secondary | ICD-10-CM | POA: Diagnosis not present

## 2022-04-10 ENCOUNTER — Other Ambulatory Visit (HOSPITAL_COMMUNITY): Payer: Self-pay | Admitting: Pain Medicine

## 2022-04-10 DIAGNOSIS — M545 Low back pain, unspecified: Secondary | ICD-10-CM

## 2022-04-10 DIAGNOSIS — M542 Cervicalgia: Secondary | ICD-10-CM

## 2022-04-11 ENCOUNTER — Encounter: Payer: Self-pay | Admitting: *Deleted

## 2022-04-22 ENCOUNTER — Ambulatory Visit (HOSPITAL_COMMUNITY): Payer: Self-pay

## 2022-04-22 ENCOUNTER — Other Ambulatory Visit (HOSPITAL_COMMUNITY): Payer: Self-pay | Admitting: Pain Medicine

## 2022-04-22 ENCOUNTER — Ambulatory Visit (HOSPITAL_COMMUNITY)
Admission: RE | Admit: 2022-04-22 | Discharge: 2022-04-22 | Disposition: A | Discharge status: Home / Self Care | Payer: Medicaid Other | Source: Ambulatory Visit | Attending: Pain Medicine | Admitting: Pain Medicine

## 2022-04-22 DIAGNOSIS — M545 Low back pain, unspecified: Secondary | ICD-10-CM

## 2022-04-22 DIAGNOSIS — M542 Cervicalgia: Secondary | ICD-10-CM

## 2022-05-08 ENCOUNTER — Other Ambulatory Visit (HOSPITAL_COMMUNITY): Payer: Self-pay | Admitting: Pain Medicine

## 2022-05-08 DIAGNOSIS — M461 Sacroiliitis, not elsewhere classified: Secondary | ICD-10-CM | POA: Diagnosis not present

## 2022-05-08 DIAGNOSIS — Z79899 Other long term (current) drug therapy: Secondary | ICD-10-CM | POA: Diagnosis not present

## 2022-05-08 DIAGNOSIS — M5416 Radiculopathy, lumbar region: Secondary | ICD-10-CM | POA: Diagnosis not present

## 2022-05-08 DIAGNOSIS — G894 Chronic pain syndrome: Secondary | ICD-10-CM | POA: Diagnosis not present

## 2022-05-08 DIAGNOSIS — M542 Cervicalgia: Secondary | ICD-10-CM | POA: Diagnosis not present

## 2022-05-22 DIAGNOSIS — G894 Chronic pain syndrome: Secondary | ICD-10-CM | POA: Diagnosis not present

## 2022-05-22 DIAGNOSIS — M47812 Spondylosis without myelopathy or radiculopathy, cervical region: Secondary | ICD-10-CM | POA: Diagnosis not present

## 2022-05-22 DIAGNOSIS — M542 Cervicalgia: Secondary | ICD-10-CM | POA: Diagnosis not present

## 2022-05-22 DIAGNOSIS — R29818 Other symptoms and signs involving the nervous system: Secondary | ICD-10-CM | POA: Diagnosis not present

## 2022-05-22 DIAGNOSIS — Z79899 Other long term (current) drug therapy: Secondary | ICD-10-CM | POA: Diagnosis not present

## 2022-06-05 ENCOUNTER — Encounter (HOSPITAL_COMMUNITY): Payer: Self-pay

## 2022-06-05 ENCOUNTER — Ambulatory Visit (HOSPITAL_COMMUNITY): Payer: Medicaid Other

## 2022-06-12 DIAGNOSIS — M47812 Spondylosis without myelopathy or radiculopathy, cervical region: Secondary | ICD-10-CM | POA: Diagnosis not present

## 2022-06-25 ENCOUNTER — Ambulatory Visit (HOSPITAL_COMMUNITY)
Admission: RE | Admit: 2022-06-25 | Discharge: 2022-06-25 | Disposition: A | Discharge status: Home / Self Care | Payer: Medicaid Other | Source: Ambulatory Visit | Attending: Pain Medicine | Admitting: Pain Medicine

## 2022-06-25 DIAGNOSIS — M5416 Radiculopathy, lumbar region: Secondary | ICD-10-CM | POA: Insufficient documentation

## 2022-07-17 DIAGNOSIS — M545 Low back pain, unspecified: Secondary | ICD-10-CM | POA: Diagnosis not present

## 2022-07-17 DIAGNOSIS — M542 Cervicalgia: Secondary | ICD-10-CM | POA: Diagnosis not present

## 2022-07-17 DIAGNOSIS — M5416 Radiculopathy, lumbar region: Secondary | ICD-10-CM | POA: Diagnosis not present

## 2022-07-17 DIAGNOSIS — G894 Chronic pain syndrome: Secondary | ICD-10-CM | POA: Diagnosis not present

## 2022-07-30 DIAGNOSIS — M5416 Radiculopathy, lumbar region: Secondary | ICD-10-CM | POA: Diagnosis not present

## 2022-07-30 DIAGNOSIS — M532X6 Spinal instabilities, lumbar region: Secondary | ICD-10-CM | POA: Diagnosis not present

## 2022-10-01 DIAGNOSIS — M5416 Radiculopathy, lumbar region: Secondary | ICD-10-CM | POA: Diagnosis not present

## 2022-10-01 DIAGNOSIS — M542 Cervicalgia: Secondary | ICD-10-CM | POA: Diagnosis not present

## 2022-10-04 DIAGNOSIS — M549 Dorsalgia, unspecified: Secondary | ICD-10-CM | POA: Diagnosis not present

## 2022-10-04 DIAGNOSIS — M545 Low back pain, unspecified: Secondary | ICD-10-CM | POA: Diagnosis not present

## 2022-10-04 DIAGNOSIS — I878 Other specified disorders of veins: Secondary | ICD-10-CM | POA: Diagnosis not present

## 2022-10-04 DIAGNOSIS — M5127 Other intervertebral disc displacement, lumbosacral region: Secondary | ICD-10-CM | POA: Diagnosis not present

## 2022-10-04 DIAGNOSIS — G8929 Other chronic pain: Secondary | ICD-10-CM | POA: Diagnosis not present

## 2022-10-04 DIAGNOSIS — M47816 Spondylosis without myelopathy or radiculopathy, lumbar region: Secondary | ICD-10-CM | POA: Diagnosis not present

## 2022-10-16 DIAGNOSIS — M545 Low back pain, unspecified: Secondary | ICD-10-CM | POA: Diagnosis not present

## 2022-10-16 DIAGNOSIS — M542 Cervicalgia: Secondary | ICD-10-CM | POA: Diagnosis not present

## 2022-10-16 DIAGNOSIS — G894 Chronic pain syndrome: Secondary | ICD-10-CM | POA: Diagnosis not present

## 2022-10-16 DIAGNOSIS — M5416 Radiculopathy, lumbar region: Secondary | ICD-10-CM | POA: Diagnosis not present

## 2022-10-16 DIAGNOSIS — M461 Sacroiliitis, not elsewhere classified: Secondary | ICD-10-CM | POA: Diagnosis not present

## 2022-10-23 ENCOUNTER — Ambulatory Visit: Payer: Medicaid Other | Attending: Pain Medicine

## 2022-10-23 ENCOUNTER — Other Ambulatory Visit: Payer: Self-pay

## 2022-10-23 DIAGNOSIS — M5416 Radiculopathy, lumbar region: Secondary | ICD-10-CM | POA: Insufficient documentation

## 2022-10-23 DIAGNOSIS — M6281 Muscle weakness (generalized): Secondary | ICD-10-CM | POA: Insufficient documentation
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
## 2022-10-25 ENCOUNTER — Ambulatory Visit: Payer: Medicaid Other | Admitting: Physical Therapy

## 2022-10-30 ENCOUNTER — Ambulatory Visit: Payer: Medicaid Other | Admitting: Physical Therapy

## 2022-10-30 DIAGNOSIS — M5416 Radiculopathy, lumbar region: Secondary | ICD-10-CM | POA: Diagnosis not present

## 2022-10-30 DIAGNOSIS — M6281 Muscle weakness (generalized): Secondary | ICD-10-CM

## 2022-10-30 NOTE — Therapy (Signed)
OUTPATIENT PHYSICAL THERAPY THORACOLUMBAR TREATMENT   Patient Name: [name] MRN: [id number] DOB:1970/10/20, 52 y.o., male Today's Date: 10/30/2022  END OF SESSION:  PT End of Session - 10/30/22 1100     Visit Number 2    Number of Visits 8    Date for PT Re-Evaluation 12/13/22    PT Start Time 1100    PT Stop Time 1144    PT Time Calculation (min) 44 min    Activity Tolerance Patient limited by pain    Behavior During Therapy WFL for tasks assessed/performed             Past Medical History:  Diagnosis Date   Back pain    Bipolar 1 disorder (HCC)    Chronic back pain    Depression    Migraine    Past Surgical History:  Procedure Laterality Date   FOOT SURGERY     There are no problems to display for this patient.  REFERRING PROVIDER: Carloyn Manner, MD   REFERRING DIAG: Radiculopathy, lumbar region   Rationale for Evaluation and Treatment: Rehabilitation  THERAPY DIAG:  Radiculopathy, lumbar region  Muscle weakness (generalized)  ONSET DATE: 2015  SUBJECTIVE:                                                                                                                                                                                           SUBJECTIVE STATEMENT: Patient reports that he feels about the same today.   PERTINENT HISTORY:  Bipolar disorder, depression, current smoker, and chronic cervical pain  PAIN:  Are you having pain? Yes: NPRS scale: 8/10 Pain location: low back and radiating down right lower extremity Pain description: constant sharp and burning Aggravating factors: squatting, lifting, heat, ice, prolonged positions, sleeping (2 hours at most), walking  Relieving factors: medication  PRECAUTIONS: None  RED FLAGS: None   WEIGHT BEARING RESTRICTIONS: No  FALLS:  Has patient fallen in last 6 months?  No, but he can lose his balance when walking outside  LIVING ENVIRONMENT: Lives with: lives with their  spouse Lives in: House/apartment Stairs: Yes: External: 5 steps; none Has following equipment at home: None  OCCUPATION: disability  PLOF: Independent  PATIENT GOALS: improved mobility, reduced pain, and be able to do his normal daily activities  NEXT MD VISIT: 11/05/22 (virtual call)   OBJECTIVE:   DIAGNOSTIC FINDINGS: 07/05/22 lumbar MRI  IMPRESSION: 1. No acute findings or clear explanation for the patient's symptoms. 2. 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 3.  No other significant disc space findings. 4. The previously demonstrated small enlarging sclerotic lesions within the T12 and L1 vertebral bodies are most consistent with incidental bone islands. No aggressive osseous lesions. 5. Chronic bilateral sacroiliac joint ankylosis.  SCREENING FOR RED FLAGS: Bowel or bladder incontinence: No Spinal tumors: No Cauda equina syndrome: No Compression fracture: No Abdominal aneurysm: No  COGNITION: Overall cognitive status: Within functional limits for tasks assessed     SENSATION: Light touch: Impaired with diminished sensation throughout RLE Patient reports intermittent numbness in his right lower extremity.   POSTURE: rounded shoulders, forward head, and flexed trunk   PALPATION: Familiar pain reproduced with palpation to bilateral lumbar paraspinals, QL, and gluteals  JOINT MOBILITY:  L1-3: hypomobile and nonpainful  L3-S1: hypomobile and painful with the most pain at L5  LUMBAR ROM:   AROM eval  Flexion 28; limited by familiar  Extension 6; limited by familiar pain   Right lateral flexion   Left lateral flexion   Right rotation 50% limited; limited by familiar pain   Left rotation 25% limited; "not as bad" "just pressure"    (Blank rows = not tested)  LOWER EXTREMITY ROM: unable to be assessed  due to pain severity and irritability  LOWER EXTREMITY MMT:    MMT Right eval Left eval  Hip flexion 3+/5; limited by familiar pain 4-/5; limited by familiar pain  Hip extension    Hip abduction    Hip adduction    Hip internal rotation    Hip external rotation    Knee flexion 3/5; limited by familiar pain  3+/5; limited by familiar pain  Knee extension 3/5; limited by familiar pain 3+/5; limited by familiar pain  Ankle dorsiflexion 3+/5 3+/5  Ankle plantarflexion    Ankle inversion    Ankle eversion     (Blank rows = not tested)  LUMBAR SPECIAL TESTS:  Unable to be assessed due to pain severity and irritability  GAIT: Assistive device utilized: None Level of assistance: Complete Independence Comments: reduced gait speed and stride length with decreased right knee flexion in swing phase  TODAY'S TREATMENT:                                                                                                                              DATE:                                     10/30/22 EXERCISE LOG  Exercise Repetitions and Resistance Comments  Nustep  L3 x 16 minutes   Glute sets 2 minutes w/ 5 second hold   Supine HS set 3 minutes w/ 5 second hold   Supine hip ADD isometric  3 minutes w/ 5 second hold        Blank cell = exercise not performed today  Manual Therapy Soft Tissue Mobilization: bilateral lumbar paraspinals and QL, for  reduced pain and tone    PATIENT EDUCATION:  Education details: POC, prognosis, healing, anatomy, referred pain, and goals for therapy Person educated: Patient Education method: Explanation Education comprehension: verbalized understanding  HOME EXERCISE PROGRAM: [id number]  ASSESSMENT:  CLINICAL IMPRESSION: Patient was introduced to multiple new interventions for improved mobility and reduced pain. He required moderate multimodal cueing with today's isometric interventions for proper muscular engagement while limiting his familiar symptoms.  Manual therapy focused on soft tissue mobilization to his lumbar paraspinals which was unable to significantly reduce his familiar symptoms. His HEP was updated with today's isometric interventions. He reported feeling comfortable with these interventions. He reported feeling alright upon the conclusion of treatment. He continues to require skilled physical therapy to address his remaining impairments to maximize his functional mobility.   OBJECTIVE IMPAIRMENTS: Abnormal gait, decreased activity tolerance, decreased endurance, decreased mobility, difficulty walking, decreased ROM, decreased strength, hypomobility, impaired sensation, impaired tone, postural dysfunction, and pain.   ACTIVITY LIMITATIONS: carrying, lifting, bending, sitting, standing, squatting, sleeping, transfers, dressing, and locomotion level  PARTICIPATION LIMITATIONS: meal prep, cleaning, laundry, driving, shopping, community activity, and yard work  PERSONAL FACTORS: Past/current experiences, Time since onset of injury/illness/exacerbation, and 3+ comorbidities: Bipolar disorder, depression, current smoker, and chronic cervical pain  are also affecting patient's functional outcome.   REHAB POTENTIAL: Fair    CLINICAL DECISION MAKING: Unstable/unpredictable  EVALUATION COMPLEXITY: High   GOALS: Goals reviewed with patient? Yes  LONG TERM GOALS: Target date: 11/20/22  Patient will be independent with his HEP.  Baseline:  Goal status: INITIAL  2.  Patient will be able to complete his daily activities without his familiar pain exceeding 8/10. Baseline:  Goal status: INITIAL  3.  Patient will be able to demonstrate at least 40 degrees of lumbar flexion for improved lumbar mobility.  Baseline:  Goal status: INITIAL  4.  Patient will improve his gross bilateral lower extremity strength to at least 4-/5 for improved function with his daily activities.  Baseline:  Goal status: INITIAL  PLAN:  PT FREQUENCY:  2x/week  PT DURATION: 4 weeks  PLANNED INTERVENTIONS: Therapeutic exercises, Therapeutic activity, Neuromuscular re-education, Balance training, Gait training, Patient/Family education, Self Care, Joint mobilization, Stair training, Electrical stimulation, Spinal mobilization, Traction, Ultrasound, Manual therapy, and Re-evaluation.  PLAN FOR NEXT SESSION: nustep, isometrics, provide HEP, and modalities as needed   Granville Lewis, PT 10/30/2022, 3:48 PM

## 2023-12-19 ENCOUNTER — Other Ambulatory Visit (HOSPITAL_COMMUNITY): Payer: Self-pay

## 2023-12-19 ENCOUNTER — Encounter (HOSPITAL_COMMUNITY): Payer: Self-pay

## 2023-12-19 DIAGNOSIS — Z720 Tobacco use: Secondary | ICD-10-CM

## 2023-12-19 DIAGNOSIS — Z122 Encounter for screening for malignant neoplasm of respiratory organs: Secondary | ICD-10-CM

## 2024-01-01 ENCOUNTER — Ambulatory Visit (HOSPITAL_COMMUNITY): Admission: RE | Admit: 2024-01-01 | Source: Ambulatory Visit

## 2024-01-01 ENCOUNTER — Encounter (HOSPITAL_COMMUNITY): Payer: Self-pay

## 2024-01-08 ENCOUNTER — Ambulatory Visit (HOSPITAL_COMMUNITY)
Admission: RE | Admit: 2024-01-08 | Discharge: 2024-01-08 | Disposition: A | Discharge status: Home / Self Care | Source: Ambulatory Visit

## 2024-01-08 DIAGNOSIS — I2510 Atherosclerotic heart disease of native coronary artery without angina pectoris: Secondary | ICD-10-CM | POA: Diagnosis not present

## 2024-01-08 DIAGNOSIS — I700 Atherosclerosis of aorta: Secondary | ICD-10-CM | POA: Insufficient documentation

## 2024-01-08 DIAGNOSIS — Z122 Encounter for screening for malignant neoplasm of respiratory organs: Secondary | ICD-10-CM | POA: Diagnosis not present

## 2024-01-08 DIAGNOSIS — J439 Emphysema, unspecified: Secondary | ICD-10-CM | POA: Insufficient documentation

## 2024-01-08 DIAGNOSIS — F17210 Nicotine dependence, cigarettes, uncomplicated: Secondary | ICD-10-CM | POA: Insufficient documentation

## 2024-01-08 DIAGNOSIS — Z720 Tobacco use: Secondary | ICD-10-CM
